# Patient Record
Sex: Female | Born: 1970 | Race: Black or African American | Hispanic: No | Marital: Married | Smoking: Never smoker
Health system: Southern US, Community
[De-identification: ages and names within clinical notes are randomized; demographics above are authoritative.]

## PROBLEM LIST (undated history)

## (undated) DIAGNOSIS — I10 Essential (primary) hypertension: Secondary | ICD-10-CM

## (undated) HISTORY — PX: GALLBLADDER SURGERY: SHX652

---

## 1997-08-27 ENCOUNTER — Inpatient Hospital Stay (HOSPITAL_COMMUNITY): Admission: AD | Admit: 1997-08-27 | Discharge: 1997-08-27 | Payer: Self-pay | Admitting: Obstetrics and Gynecology

## 1997-08-27 ENCOUNTER — Inpatient Hospital Stay (HOSPITAL_COMMUNITY): Admission: AD | Admit: 1997-08-27 | Discharge: 1997-08-30 | Payer: Self-pay | Admitting: Obstetrics & Gynecology

## 1997-08-31 ENCOUNTER — Encounter: Admission: RE | Admit: 1997-08-31 | Discharge: 1997-11-29 | Payer: Self-pay | Admitting: Obstetrics & Gynecology

## 1997-11-30 ENCOUNTER — Other Ambulatory Visit: Admission: RE | Admit: 1997-11-30 | Discharge: 1997-11-30 | Payer: Self-pay | Admitting: Obstetrics & Gynecology

## 1998-11-25 ENCOUNTER — Other Ambulatory Visit: Admission: RE | Admit: 1998-11-25 | Discharge: 1998-11-25 | Payer: Self-pay | Admitting: Obstetrics and Gynecology

## 1999-06-20 ENCOUNTER — Inpatient Hospital Stay (HOSPITAL_COMMUNITY): Admission: AD | Admit: 1999-06-20 | Discharge: 1999-06-22 | Payer: Self-pay | Admitting: Obstetrics and Gynecology

## 1999-12-19 ENCOUNTER — Other Ambulatory Visit: Admission: RE | Admit: 1999-12-19 | Discharge: 1999-12-19 | Payer: Self-pay | Admitting: Obstetrics and Gynecology

## 2000-07-23 ENCOUNTER — Ambulatory Visit (HOSPITAL_COMMUNITY): Admission: RE | Admit: 2000-07-23 | Discharge: 2000-07-23 | Payer: Self-pay | Admitting: Family Medicine

## 2000-07-23 ENCOUNTER — Encounter: Payer: Self-pay | Admitting: Family Medicine

## 2000-08-01 ENCOUNTER — Encounter (INDEPENDENT_AMBULATORY_CARE_PROVIDER_SITE_OTHER): Payer: Self-pay | Admitting: Specialist

## 2000-08-01 ENCOUNTER — Observation Stay (HOSPITAL_COMMUNITY): Admission: RE | Admit: 2000-08-01 | Discharge: 2000-08-02 | Payer: Self-pay | Admitting: Surgery

## 2000-12-25 ENCOUNTER — Other Ambulatory Visit: Admission: RE | Admit: 2000-12-25 | Discharge: 2000-12-25 | Payer: Self-pay | Admitting: Obstetrics and Gynecology

## 2001-08-22 ENCOUNTER — Ambulatory Visit (HOSPITAL_COMMUNITY): Admission: RE | Admit: 2001-08-22 | Discharge: 2001-08-22 | Payer: Self-pay | Admitting: Family Medicine

## 2001-08-22 ENCOUNTER — Encounter: Payer: Self-pay | Admitting: Family Medicine

## 2002-03-14 ENCOUNTER — Encounter: Admission: RE | Admit: 2002-03-14 | Discharge: 2002-03-14 | Payer: Self-pay | Admitting: Obstetrics and Gynecology

## 2002-03-14 ENCOUNTER — Encounter: Payer: Self-pay | Admitting: Obstetrics and Gynecology

## 2004-06-28 ENCOUNTER — Other Ambulatory Visit: Admission: RE | Admit: 2004-06-28 | Discharge: 2004-06-28 | Payer: Self-pay | Admitting: Obstetrics and Gynecology

## 2004-07-01 ENCOUNTER — Encounter: Admission: RE | Admit: 2004-07-01 | Discharge: 2004-07-01 | Payer: Self-pay | Admitting: Obstetrics and Gynecology

## 2005-10-23 ENCOUNTER — Other Ambulatory Visit: Admission: RE | Admit: 2005-10-23 | Discharge: 2005-10-23 | Payer: Self-pay | Admitting: Obstetrics and Gynecology

## 2007-02-20 ENCOUNTER — Encounter: Admission: RE | Admit: 2007-02-20 | Discharge: 2007-02-20 | Payer: Self-pay | Admitting: Obstetrics and Gynecology

## 2008-02-21 ENCOUNTER — Encounter: Admission: RE | Admit: 2008-02-21 | Discharge: 2008-02-21 | Payer: Self-pay | Admitting: Obstetrics and Gynecology

## 2010-06-14 ENCOUNTER — Other Ambulatory Visit: Payer: Self-pay | Admitting: Family Medicine

## 2010-06-14 DIAGNOSIS — Z1231 Encounter for screening mammogram for malignant neoplasm of breast: Secondary | ICD-10-CM

## 2010-06-17 ENCOUNTER — Ambulatory Visit
Admission: RE | Admit: 2010-06-17 | Discharge: 2010-06-17 | Disposition: A | Discharge status: Home / Self Care | Payer: BC Managed Care – PPO | Source: Ambulatory Visit | Attending: Family Medicine | Admitting: Family Medicine

## 2010-06-17 DIAGNOSIS — Z1231 Encounter for screening mammogram for malignant neoplasm of breast: Secondary | ICD-10-CM

## 2010-07-18 ENCOUNTER — Other Ambulatory Visit: Payer: Self-pay | Admitting: Family Medicine

## 2010-07-18 ENCOUNTER — Other Ambulatory Visit (HOSPITAL_COMMUNITY)
Admission: RE | Admit: 2010-07-18 | Discharge: 2010-07-18 | Disposition: A | Discharge status: Home / Self Care | Payer: BC Managed Care – PPO | Source: Ambulatory Visit | Attending: Family Medicine | Admitting: Family Medicine

## 2010-07-18 DIAGNOSIS — Z0000 Encounter for general adult medical examination without abnormal findings: Secondary | ICD-10-CM | POA: Insufficient documentation

## 2010-09-23 NOTE — Op Note (Signed)
[facility]  Patient:    Annette Thomas, Annette Thomas                      MRN: [id number] Proc. Date: 08/01/00 Adm. Date:  [id number] Attending:  Oletha Cruel CC:         Judithann Sauger, M.D.  Emeline General Dema Severin, M.D.   Operative Report  CCS#:  [id number]  PREOPERATIVE DIAGNOSIS:  Chronic calculous cholecystitis.  POSTOPERATIVE DIAGNOSIS:  Chronic calculous cholecystitis.  OPERATION PERFORMED:  Laparoscopic cholecystectomy.  SURGEON:  Dr. Margot Chimes.  ASSISTANT:  Dr. March Rummage.  ANESTHESIA:  General endotracheal.  CLINICAL HISTORY:  The patient is a 40 year old woman with a recent development of biliary type symptoms. Multiple small stones were seen on ultrasound.  DESCRIPTION OF PROCEDURE:  The patient was brought to the operating room and after satisfactory general endotracheal anesthesia had been obtained, the abdomen was prepped and draped. A 0.25% Marcaine was used for each incision. I made an umbilical incision, picked up the fascia with a Kocher, opened it and entered the peritoneal cavity under direct vision. A pursestring was placed. The Hasson was introduced and the abdomen insufflated to 15. The camera was placed and there appeared to be no gross abnormalities and no evidence of any bowel injuries with the entry.  The patient was placed in reverse Trendelenburg and tilted to the left. A 10 mm trocar was placed in the epigastrium and two 5s in the right upper abdomen, all under direct vision. The gallbladder was grasped and retracted over the liver. The peritoneum over the cystic duct was opened, there was a lot of fibrosis here and we could see that the common duct was tenting up, so I opened the peritoneum on both sides of the gallbladder to make a window back in the triangle of Calot. The cystic artery was dissected out and was coming right up on top of the cystic duct and up onto the gallbladder and I dissected it up onto the gallbladder to  make sure that anatomy was clear and got a good window around the cystic duct so that I could clearly see its junction with the gallbladder. It appeared that the cystic duct was relatively short. Once the anatomy was clear, I clipped the cystic artery and divided it. This gave a little bit more mobility and the cystic duct was then clipped with two clips on the stay side and divided. A small posterior branch of the cystic artery was then dissected out, clipped and divided.  The gallbladder was removed from below to above with coagulation current and the cautery. Just prior to disconnecting, we made sure everything was dry and then disconnected the gallbladder and brought it out the umbilical port.  The abdomen was reinsufflated and final check made for hemostasis and again everything appeared dry. The lateral ports were removed under direct vision. The umbilical port was removed and the pursestring tied down with the camera in the epigastric port to check that site. The abdomen was deflated through the epigastric port. The skin was closed with 4-0 monocryl subcuticular plus Steri-Strips. The patient tolerated the procedure well. There were no operative complications and all counts were correct. DD:  08/01/00 TD:  08/01/00 Job: [id number]

## 2011-07-21 ENCOUNTER — Other Ambulatory Visit: Payer: Self-pay | Admitting: Obstetrics and Gynecology

## 2011-07-21 DIAGNOSIS — Z1231 Encounter for screening mammogram for malignant neoplasm of breast: Secondary | ICD-10-CM

## 2011-08-01 ENCOUNTER — Ambulatory Visit (INDEPENDENT_AMBULATORY_CARE_PROVIDER_SITE_OTHER): Payer: 59 | Admitting: Obstetrics and Gynecology

## 2011-08-01 ENCOUNTER — Ambulatory Visit
Admission: RE | Admit: 2011-08-01 | Discharge: 2011-08-01 | Disposition: A | Discharge status: Home / Self Care | Payer: 59 | Source: Ambulatory Visit | Attending: Obstetrics and Gynecology | Admitting: Obstetrics and Gynecology

## 2011-08-01 DIAGNOSIS — Z01419 Encounter for gynecological examination (general) (routine) without abnormal findings: Secondary | ICD-10-CM

## 2011-08-01 DIAGNOSIS — Z1231 Encounter for screening mammogram for malignant neoplasm of breast: Secondary | ICD-10-CM

## 2011-08-04 ENCOUNTER — Ambulatory Visit: Payer: 59 | Admitting: Emergency Medicine

## 2011-08-04 DIAGNOSIS — F411 Generalized anxiety disorder: Secondary | ICD-10-CM

## 2011-08-04 DIAGNOSIS — R079 Chest pain, unspecified: Secondary | ICD-10-CM

## 2011-08-04 DIAGNOSIS — R51 Headache: Secondary | ICD-10-CM

## 2011-08-04 DIAGNOSIS — M79603 Pain in arm, unspecified: Secondary | ICD-10-CM

## 2011-08-04 DIAGNOSIS — F419 Anxiety disorder, unspecified: Secondary | ICD-10-CM

## 2011-08-04 DIAGNOSIS — M79609 Pain in unspecified limb: Secondary | ICD-10-CM

## 2011-08-04 MED ORDER — HYDROCHLOROTHIAZIDE 12.5 MG PO CAPS: 12.5000 mg | ORAL_CAPSULE | Freq: Every day | ORAL | Status: DC

## 2011-08-04 MED ORDER — ALPRAZOLAM 0.25 MG PO TABS: ORAL_TABLET | ORAL | Status: DC

## 2011-08-04 NOTE — Progress Notes (Signed)
  Subjective:    Patient ID: Annette Thomas, female    DOB: 03/30/1971, 41 y.o.   MRN: [id number]  HPI patient enters with onset last night of symptoms. She was with her 2 kids and walked into her house and then had a severe panic type episodes. She felt like she was out of control. She is having crying associated with this. She has been overwhelmed recently with work and home her husband is working  out of town and so she has been taking care of her 2 kids which arre 65 and 14 by herself, As well is working full-time. She overall is in good health. She keep her regular appointments with her GYN doctor.    Review of Systems she's had a frontal type headache she's had a vague chest discomfort but overall has been feeling well.     Objective:   Physical Exam  Constitutional: She is oriented to person, place, and time. She appears well-developed.  HENT:  Head: Normocephalic.  Eyes: Pupils are equal, round, and reactive to light.  Neck: No JVD present. No tracheal deviation present. No thyromegaly present.  Cardiovascular: Normal rate and regular rhythm.   Pulmonary/Chest: No respiratory distress. She has no wheezes. She has no rales. She exhibits no tenderness.  Musculoskeletal: Normal range of motion.  Lymphadenopathy:    She has no cervical adenopathy.  Neurological: She is alert and oriented to person, place, and time. She has normal reflexes. She displays normal reflexes. No cranial nerve deficit. She exhibits normal muscle tone. Coordination normal.  Skin: Skin is warm and dry.    EKG shows a sinus arrhythmia with no acute changes      Assessment & Plan:   Patient mowed the yard yesterday and her arms started hurting after she mowed the yard. She then had an episode of severe anxiety when she came inside the house last night. She is under an incredible amount of stress related to working 2 young children and her husband being out of town.

## 2011-08-04 NOTE — Patient Instructions (Signed)
Please contact the EAP. Please followup regarding her symptoms with ear PCP and decide on further treatment

## 2011-08-28 ENCOUNTER — Other Ambulatory Visit: Payer: Self-pay

## 2011-08-28 DIAGNOSIS — Z975 Presence of (intrauterine) contraceptive device: Secondary | ICD-10-CM

## 2011-09-05 ENCOUNTER — Encounter: Payer: Self-pay | Admitting: Obstetrics and Gynecology

## 2011-09-05 ENCOUNTER — Ambulatory Visit (INDEPENDENT_AMBULATORY_CARE_PROVIDER_SITE_OTHER): Payer: 59 | Admitting: Obstetrics and Gynecology

## 2011-09-05 ENCOUNTER — Encounter: Payer: 59 | Admitting: Obstetrics and Gynecology

## 2011-09-05 ENCOUNTER — Ambulatory Visit (INDEPENDENT_AMBULATORY_CARE_PROVIDER_SITE_OTHER): Payer: 59

## 2011-09-05 ENCOUNTER — Other Ambulatory Visit: Payer: Self-pay | Admitting: Obstetrics and Gynecology

## 2011-09-05 VITALS — BP 120/72 | HR 78 | Ht 67.5 in | Wt 161.0 lb

## 2011-09-05 DIAGNOSIS — N943 Premenstrual tension syndrome: Secondary | ICD-10-CM

## 2011-09-05 DIAGNOSIS — D259 Leiomyoma of uterus, unspecified: Secondary | ICD-10-CM

## 2011-09-05 DIAGNOSIS — Z309 Encounter for contraceptive management, unspecified: Secondary | ICD-10-CM

## 2011-09-05 DIAGNOSIS — Z975 Presence of (intrauterine) contraceptive device: Secondary | ICD-10-CM

## 2011-09-05 DIAGNOSIS — IMO0001 Reserved for inherently not codable concepts without codable children: Secondary | ICD-10-CM

## 2011-09-05 DIAGNOSIS — D219 Benign neoplasm of connective and other soft tissue, unspecified: Secondary | ICD-10-CM | POA: Insufficient documentation

## 2011-09-05 DIAGNOSIS — Z30433 Encounter for removal and reinsertion of intrauterine contraceptive device: Secondary | ICD-10-CM

## 2011-09-05 NOTE — Progress Notes (Addendum)
IUD INSERTION NOTE  Annette Thomas is a 41 y.o. female G2P2002 who presents for IUD insertion after removal of her current device (Paragard)  Consent signed after risks and benefits were reviewed including but not limited to bleeding, infection, expulsion and risk of uterine perforation that may require an additional procedure for removal.  LMP: Patient's last menstrual period was 08/13/2011. UPT: negative GC / Chlamydia: N/A  Paragard LOT NUMBER: [id number]   Prepping with Betadine/Hibiclens  Removed Paragard without difficulty with ring forceps; additional prepping with Betadine then Tenaculum placed on anterior lip of cervix after Hurricane gel was applied Uterus sounded at  8 cm Insertion of Paragard IUD per protocol without any complications  Ultrasound F/U(prior to IUD removal & reinsertion) wnl except a 2.9 x 2.5 x 2.6 cm fibroid with properly placed IUD by 3D rendering.  Assessment:  IUD Insertion                        PMS                        Ultrasound F/U for missing IUD string                        Small Fibroid  Plan:  Reviewed hormonal & supplemental management  options for PMS, patient to try FemPremenstrual herbal compound.  Reviewed fibroids along with medical and surgical management, included observation  1. Patient instructed to call with oral temperature of 100.4 degrees Fahrenheit or more, excessive bleeding or pain that is not relieved with OTC analgesia taken as directed  2. Patient instructed on how  to check IUD strings and encouraged to do so after each menstrual cycle  3. Advised not to place anything in vagina or have sexual intercourse for 7 days  4. Follow-up: 4  weeks   Bernetha Anschutz  PA-C 09/05/2011 2:50 PM

## 2011-09-05 NOTE — Patient Instructions (Signed)
Follow up in 4 weeks  Call [facility] @ [phone] if:  You have a temperature greater than or equal to 100.4 degrees Farenheit orally You have pain that is not made better by the pain medication given and taken as directed You have excessive bleeding   Do not place anything in your vagina for the next 7 days

## 2011-09-11 ENCOUNTER — Telehealth: Payer: Self-pay | Admitting: Obstetrics and Gynecology

## 2011-09-11 NOTE — Telephone Encounter (Signed)
Triage received

## 2011-09-12 NOTE — Telephone Encounter (Signed)
TC TO PT ON ALL LISTED NUMBERS REGARDING MSG, LM ON VMS TO CALL BACK

## 2011-09-12 NOTE — Telephone Encounter (Signed)
PT RTND CALL, LM ON VMS TO CALL BACK

## 2011-09-13 NOTE — Telephone Encounter (Signed)
PT CALLED STATES HAD A PARAGUARD INSERTED LAST Tuesday, HAD HER LAST ONE REMOVED AND THEN REINSERTED NEW ONE.  PT STATES HAD LAST ONE IN FOR 11 YEARS, HAD SOME MODERATE BLDG AND CRAMPING, IS NOT CHANGING A SOAKED PAD Q HR AND BLDG HAS ACTUALLY GOTTEN BETTER, WANTS TO KNOW IF NORMAL.  PT ADVISED IS NORMAL TO HAVE IRREGULAR BLDG FOR UP TO 3 MONTHS ESP SINCE SHE WENT AN EXTRA YEAR WITHOUT HAVING THE IUD REINSERTED, PT ADVISED TO CALL IF BLDG OR PAIN INCREASES OR ANY OTHER CONCERNS, VOICES UNDERSTANDING.

## 2011-10-05 ENCOUNTER — Encounter: Payer: 59 | Admitting: Obstetrics and Gynecology

## 2012-02-24 ENCOUNTER — Other Ambulatory Visit: Payer: Self-pay | Admitting: *Deleted

## 2012-02-24 ENCOUNTER — Other Ambulatory Visit: Payer: Self-pay | Admitting: Emergency Medicine

## 2012-06-18 ENCOUNTER — Other Ambulatory Visit: Payer: Self-pay | Admitting: Family Medicine

## 2012-06-18 ENCOUNTER — Ambulatory Visit
Admission: RE | Admit: 2012-06-18 | Discharge: 2012-06-18 | Disposition: A | Discharge status: Home / Self Care | Payer: BC Managed Care – PPO | Source: Ambulatory Visit | Attending: Family Medicine | Admitting: Family Medicine

## 2012-06-18 DIAGNOSIS — M25571 Pain in right ankle and joints of right foot: Secondary | ICD-10-CM

## 2012-06-18 DIAGNOSIS — M79671 Pain in right foot: Secondary | ICD-10-CM

## 2012-09-13 ENCOUNTER — Other Ambulatory Visit: Payer: Self-pay

## 2012-09-13 DIAGNOSIS — Z1231 Encounter for screening mammogram for malignant neoplasm of breast: Secondary | ICD-10-CM

## 2013-02-13 ENCOUNTER — Ambulatory Visit
Admission: RE | Admit: 2013-02-13 | Discharge: 2013-02-13 | Disposition: A | Discharge status: Home / Self Care | Payer: 59 | Source: Ambulatory Visit

## 2013-02-13 DIAGNOSIS — Z1231 Encounter for screening mammogram for malignant neoplasm of breast: Secondary | ICD-10-CM

## 2014-03-09 ENCOUNTER — Encounter: Payer: Self-pay | Admitting: Obstetrics and Gynecology

## 2014-04-12 ENCOUNTER — Other Ambulatory Visit: Payer: Self-pay | Admitting: Internal Medicine

## 2014-12-03 ENCOUNTER — Other Ambulatory Visit (HOSPITAL_COMMUNITY)
Admission: RE | Admit: 2014-12-03 | Discharge: 2014-12-03 | Disposition: A | Discharge status: Home / Self Care | Payer: BLUE CROSS/BLUE SHIELD | Source: Ambulatory Visit | Attending: Family Medicine | Admitting: Family Medicine

## 2014-12-03 ENCOUNTER — Other Ambulatory Visit: Payer: Self-pay | Admitting: Family Medicine

## 2014-12-03 DIAGNOSIS — Z01419 Encounter for gynecological examination (general) (routine) without abnormal findings: Secondary | ICD-10-CM | POA: Insufficient documentation

## 2014-12-07 LAB — CYTOLOGY - PAP

## 2015-02-25 ENCOUNTER — Other Ambulatory Visit: Payer: Self-pay | Admitting: Rheumatology

## 2015-02-25 ENCOUNTER — Other Ambulatory Visit: Payer: Self-pay | Admitting: Family Medicine

## 2015-02-25 ENCOUNTER — Ambulatory Visit
Admission: RE | Admit: 2015-02-25 | Discharge: 2015-02-25 | Disposition: A | Discharge status: Home / Self Care | Payer: 59 | Source: Ambulatory Visit | Attending: Rheumatology | Admitting: Rheumatology

## 2015-02-25 ENCOUNTER — Ambulatory Visit
Admission: RE | Admit: 2015-02-25 | Discharge: 2015-02-25 | Disposition: A | Discharge status: Home / Self Care | Payer: 59 | Source: Ambulatory Visit | Attending: Family Medicine | Admitting: Family Medicine

## 2015-02-25 DIAGNOSIS — J069 Acute upper respiratory infection, unspecified: Secondary | ICD-10-CM

## 2015-02-25 DIAGNOSIS — IMO0002 Reserved for concepts with insufficient information to code with codable children: Secondary | ICD-10-CM

## 2015-02-25 DIAGNOSIS — R229 Localized swelling, mass and lump, unspecified: Principal | ICD-10-CM

## 2015-03-09 ENCOUNTER — Other Ambulatory Visit: Payer: Self-pay

## 2015-03-09 ENCOUNTER — Ambulatory Visit
Admission: RE | Admit: 2015-03-09 | Discharge: 2015-03-09 | Disposition: A | Discharge status: Home / Self Care | Payer: 59 | Source: Ambulatory Visit

## 2015-03-09 DIAGNOSIS — Z1231 Encounter for screening mammogram for malignant neoplasm of breast: Secondary | ICD-10-CM

## 2015-10-04 ENCOUNTER — Encounter (HOSPITAL_COMMUNITY): Payer: Self-pay | Admitting: Emergency Medicine

## 2015-10-04 ENCOUNTER — Emergency Department (HOSPITAL_COMMUNITY)
Admission: EM | Admit: 2015-10-04 | Discharge: 2015-10-04 | Disposition: A | Discharge status: Home / Self Care | Payer: 59 | Attending: Emergency Medicine | Admitting: Emergency Medicine

## 2015-10-04 DIAGNOSIS — Z79899 Other long term (current) drug therapy: Secondary | ICD-10-CM | POA: Diagnosis not present

## 2015-10-04 DIAGNOSIS — J029 Acute pharyngitis, unspecified: Secondary | ICD-10-CM | POA: Diagnosis present

## 2015-10-04 LAB — COMPREHENSIVE METABOLIC PANEL
ALT: 21 U/L (ref 14–54)
AST: 15 U/L (ref 15–41)
Albumin: 3.7 g/dL (ref 3.5–5.0)
Alkaline Phosphatase: 70 U/L (ref 38–126)
Anion gap: 8 (ref 5–15)
BUN: 8 mg/dL (ref 6–20)
CO2: 27 mmol/L (ref 22–32)
Calcium: 8.9 mg/dL (ref 8.9–10.3)
Chloride: 105 mmol/L (ref 101–111)
Creatinine, Ser: 0.89 mg/dL (ref 0.44–1.00)
GFR calc Af Amer: >60 mL/min (ref >60)
GFR calc non Af Amer: >60 mL/min (ref >60)
Glucose, Bld: 96 mg/dL (ref 65–99)
Potassium: 3.5 mmol/L (ref 3.5–5.1)
Sodium: 140 mmol/L (ref 135–145)
Total Bilirubin: 0.5 mg/dL (ref 0.3–1.2)
Total Protein: 7.4 g/dL (ref 6.5–8.1)

## 2015-10-04 LAB — CBC
HCT: 35.3 % — ABNORMAL LOW (ref 36.0–46.0)
Hemoglobin: 11.6 g/dL — ABNORMAL LOW (ref 12.0–15.0)
MCH: 26.1 pg (ref 26.0–34.0)
MCHC: 32.9 g/dL (ref 30.0–36.0)
MCV: 79.5 fL (ref 78.0–100.0)
Platelets: 333 10*3/uL (ref 150–400)
RBC: 4.44 MIL/uL (ref 3.87–5.11)
RDW: 14.3 % (ref 11.5–15.5)
WBC: 8.5 10*3/uL (ref 4.0–10.5)

## 2015-10-04 LAB — URINALYSIS, ROUTINE W REFLEX MICROSCOPIC
Glucose, UA: NEGATIVE mg/dL
KETONES UR: 40 mg/dL — AB
LEUKOCYTES UA: NEGATIVE
NITRITE: NEGATIVE
PH: 6 (ref 5.0–8.0)
PROTEIN: 30 mg/dL — AB
Specific Gravity, Urine: 1.025 (ref 1.005–1.030)

## 2015-10-04 LAB — LIPASE, BLOOD: Lipase: 19 U/L (ref 11–51)

## 2015-10-04 LAB — I-STAT BETA HCG BLOOD, ED (MC, WL, AP ONLY)

## 2015-10-04 LAB — URINE MICROSCOPIC-ADD ON: Squamous Epithelial / LPF: NONE SEEN

## 2015-10-04 LAB — RAPID STREP SCREEN (MED CTR MEBANE ONLY): STREPTOCOCCUS, GROUP A SCREEN (DIRECT): NEGATIVE

## 2015-10-04 MED ORDER — LIDOCAINE VISCOUS 2 % MT SOLN
15.0000 mL | Freq: Once | OROMUCOSAL | Status: AC
  Administered 2015-10-04: 15 mL via OROMUCOSAL
  Filled 2015-10-04: qty 15

## 2015-10-04 MED ORDER — DEXAMETHASONE 4 MG PO TABS
10.0000 mg | ORAL_TABLET | Freq: Once | ORAL | Status: AC
  Administered 2015-10-04: 10 mg via ORAL
  Filled 2015-10-04: qty 2

## 2015-10-04 MED ORDER — MAGIC MOUTHWASH: ORAL | Status: DC

## 2015-10-04 NOTE — ED Notes (Signed)
Pt called to get dosing on magic mouthwash prescribed to her.  Tyrone Nine EDP is already gone for the day, Ralene Bathe EDP gave VO to take magic mouthwash 63m Q6 hours.  Advised pt not to drive or work after taking med.  Pt verbalizes understanding.

## 2015-10-04 NOTE — ED Notes (Signed)
MD at bedside. 

## 2015-10-04 NOTE — ED Notes (Signed)
Pt c/o sore throat, sore neck and muscle pain, diarrhea, headache, onset Wednesday, went to clinic Saturday and given Augmenten for Strep throat although no test was done, flexeril. Pt states temperature was 101.5 F onset Saturday night, new symptoms oltalgia and dental pain with sores in mouth.

## 2015-10-04 NOTE — Discharge Instructions (Signed)
Take 4 over the counter ibuprofen tablets 3 times a day or 2 over-the-counter naproxen tablets twice a day for pain. Tylenol 1-2 tabs po q4h prn   Pharyngitis Pharyngitis is redness, pain, and swelling (inflammation) of your pharynx.  CAUSES  Pharyngitis is usually caused by infection. Most of the time, these infections are from viruses (viral) and are part of a cold. However, sometimes pharyngitis is caused by bacteria (bacterial). Pharyngitis can also be caused by allergies. Viral pharyngitis may be spread from person to person by coughing, sneezing, and personal items or utensils (cups, forks, spoons, toothbrushes). Bacterial pharyngitis may be spread from person to person by more intimate contact, such as kissing.  SIGNS AND SYMPTOMS  Symptoms of pharyngitis include:   Sore throat.   Tiredness (fatigue).   Low-grade fever.   Headache.  Joint pain and muscle aches.  Skin rashes.  Swollen lymph nodes.  Plaque-like film on throat or tonsils (often seen with bacterial pharyngitis). DIAGNOSIS  Your health care provider will ask you questions about your illness and your symptoms. Your medical history, along with a physical exam, is often all that is needed to diagnose pharyngitis. Sometimes, a rapid strep test is done. Other lab tests may also be done, depending on the suspected cause.  TREATMENT  Viral pharyngitis will usually get better in 3-4 days without the use of medicine. Bacterial pharyngitis is treated with medicines that kill germs (antibiotics).  HOME CARE INSTRUCTIONS   Drink enough water and fluids to keep your urine clear or pale yellow.   Only take over-the-counter or prescription medicines as directed by your health care provider:   If you are prescribed antibiotics, make sure you finish them even if you start to feel better.   Do not take aspirin.   Get lots of rest.   Gargle with 8 oz of salt water ( tsp of salt per 1 qt of water) as often as every  1-2 hours to soothe your throat.   Throat lozenges (if you are not at risk for choking) or sprays may be used to soothe your throat. SEEK MEDICAL CARE IF:   You have large, tender lumps in your neck.  You have a rash.  You cough up green, yellow-brown, or bloody spit. SEEK IMMEDIATE MEDICAL CARE IF:   Your neck becomes stiff.  You drool or are unable to swallow liquids.  You vomit or are unable to keep medicines or liquids down.  You have severe pain that does not go away with the use of recommended medicines.  You have trouble breathing (not caused by a stuffy nose). MAKE SURE YOU:   Understand these instructions.  Will watch your condition.  Will get help right away if you are not doing well or get worse.   This information is not intended to replace advice given to you by your health care provider. Make sure you discuss any questions you have with your health care provider.   Document Released: 04/24/2005 Document Revised: 02/12/2013 Document Reviewed: 12/30/2012 Elsevier Interactive Patient Education [employer].

## 2015-10-04 NOTE — ED Provider Notes (Signed)
CSN: [id number]     Arrival date & time 10/04/15  1027 History   First MD Initiated Contact with Patient 10/04/15 1209     Chief Complaint  Patient presents with  . Sore Throat  . Diarrhea     (Consider location/radiation/quality/duration/timing/severity/associated sxs/prior Treatment) Patient is a 45 y.o. female presenting with pharyngitis. The history is provided by the patient.  Sore Throat This is a new problem. The current episode started more than 1 week ago. The problem occurs constantly. The problem has not changed since onset.Pertinent negatives include no chest pain, no headaches and no shortness of breath. Nothing aggravates the symptoms. Nothing relieves the symptoms. She has tried nothing for the symptoms. The treatment provided no relief.   45 yo F With a chief complaint of a sore throat. This been going on for about 6 days. Was seen by her family physician who is concerned that she had strep throat and started on Augmentin. Patient has continued to have worsening symptoms. Was febrile yesterday to 101. Also having some diarrhea post antibiotic use. Denies vaginal or hand or foot involvement. History reviewed. No pertinent past medical history. Past Surgical History  Procedure Laterality Date  . Gallbladder surgery     Family History  Problem Relation Age of Onset  . Heart attack Paternal Grandfather   . Brain cancer Paternal Grandmother     tumor  . Diabetes Maternal Grandmother   . Heart attack Maternal Grandfather   . Diabetes Maternal Grandfather   . Heart attack Father   . Diabetes Mother    Social History  Substance Use Topics  . Smoking status: Never Smoker   . Smokeless tobacco: Never Used  . Alcohol Use: Yes     Comment: social   OB History    Gravida Para Term Preterm AB TAB SAB Ectopic Multiple Living   2 2 2       2      Review of Systems  Constitutional: Negative for fever and chills.  HENT: Positive for sore throat. Negative for congestion and  rhinorrhea.   Eyes: Negative for redness and visual disturbance.  Respiratory: Negative for shortness of breath and wheezing.   Cardiovascular: Negative for chest pain and palpitations.  Gastrointestinal: Negative for nausea and vomiting.  Genitourinary: Negative for dysuria and urgency.  Musculoskeletal: Negative for myalgias and arthralgias.  Skin: Negative for pallor and wound.  Neurological: Negative for dizziness and headaches.      Allergies  Review of patient's allergies indicates no known allergies.  Home Medications   Prior to Admission medications   Medication Sig Start Date End Date Taking? Authorizing Provider  amLODipine (NORVASC) 5 MG tablet Take 5 mg by mouth daily.   Yes Historical Provider, MD  cetirizine (ZYRTEC) 10 MG tablet Take 10 mg by mouth daily.   Yes Historical Provider, MD  fluticasone (FLONASE) 50 MCG/ACT nasal spray Place into both nostrils daily.   Yes Historical Provider, MD  ALPRAZolam Duanne Moron) 0.25 MG tablet TAKE ONE-HALF TO ONE TABLET BY MOUTH AS NEEDED FOR STRESS Patient not taking: Reported on 10/04/2015 02/24/12   Areta Haber Dunn, PA-C  hydrochlorothiazide (MICROZIDE) 12.5 MG capsule Take 1 capsule (12.5 mg total) by mouth daily. Patient not taking: Reported on 10/04/2015 08/04/11 08/03/12  Darlyne Russian, MD  magic mouthwash SOLN 1: 1 benadryl to maalox 10/04/15   Deno Etienne, DO   BP 132/87 mmHg  Pulse 90  Temp(Src) 99.3 F (37.4 C) (Oral)  Resp 18  SpO2 100%  Physical Exam  Constitutional: She is oriented to person, place, and time. She appears well-developed and well-nourished. No distress.  HENT:  Head: Normocephalic and atraumatic.  Pustular nodules to posterior oropharynx. Tolerating her secretions neck with full range of motion  Eyes: EOM are normal. Pupils are equal, round, and reactive to light.  Neck: Normal range of motion. Neck supple.  Cardiovascular: Normal rate and regular rhythm.  Exam reveals no gallop and no friction rub.   No  murmur heard. Pulmonary/Chest: Effort normal. She has no wheezes. She has no rales.  Abdominal: Soft. She exhibits no distension. There is no tenderness. There is no rebound and no guarding.  Musculoskeletal: She exhibits no edema or tenderness.  Neurological: She is alert and oriented to person, place, and time.  Skin: Skin is warm and dry. She is not diaphoretic.  Psychiatric: She has a normal mood and affect. Her behavior is normal.  Nursing note and vitals reviewed.   ED Course  Procedures (including critical care time) Labs Review Labs Reviewed  CBC - Abnormal; Notable for the following:    Hemoglobin 11.6 (*)    HCT 35.3 (*)    All other components within normal limits  URINALYSIS, ROUTINE W REFLEX MICROSCOPIC (NOT AT [facility]) - Abnormal; Notable for the following:    Color, Urine AMBER (*)    APPearance HAZY (*)    Hgb urine dipstick TRACE (*)    Bilirubin Urine SMALL (*)    Ketones, ur 40 (*)    Protein, ur 30 (*)    All other components within normal limits  URINE MICROSCOPIC-ADD ON - Abnormal; Notable for the following:    Bacteria, UA RARE (*)    All other components within normal limits  RAPID STREP SCREEN (NOT AT [facility])  CULTURE, GROUP A STREP ([place])  LIPASE, BLOOD  COMPREHENSIVE METABOLIC PANEL  I-STAT BETA HCG BLOOD, ED ([facility], [facility], [facility] ONLY)    Imaging Review No results found. I have personally reviewed and evaluated these images and lab results as part of my medical decision-making.   EKG Interpretation None      MDM   Final diagnoses:  Pharyngitis    45 yo F with a chief complaint of sore throat. Patient with multiple purulent appearing nodules to the back of the throat. No large enough to drain. Suspect may be bacterial in source. However continue Augmentin. Given Decadron. NSAIDs PCP follow-up.  2:33 PM:  I have discussed the diagnosis/risks/treatment options with the patient and believe the pt to be eligible for discharge home to follow-up with PCP. We  also discussed returning to the ED immediately if new or worsening sx occur. We discussed the sx which are most concerning (e.g., sudden worsening pain, fever, inability to tolerate by mouth) that necessitate immediate return. Medications administered to the patient during their visit and any new prescriptions provided to the patient are listed below.  Medications given during this visit Medications  dexamethasone (DECADRON) tablet 10 mg (10 mg Oral Given 10/04/15 1243)  lidocaine (XYLOCAINE) 2 % viscous mouth solution 15 mL (15 mLs Mouth/Throat Given 10/04/15 1243)    Discharge Medication List as of 10/04/2015 12:47 PM    START taking these medications   Details  magic mouthwash SOLN 1: 1 benadryl to maalox, Print        The patient appears reasonably screen and/or stabilized for discharge and I doubt any other medical condition or other [facility] requiring further screening, evaluation, or treatment in the ED at this  time prior to discharge.      Deno Etienne, DO 10/04/15 1433

## 2015-10-06 LAB — CULTURE, GROUP A STREP (THRC)

## 2016-03-06 ENCOUNTER — Other Ambulatory Visit: Payer: Self-pay | Admitting: Family Medicine

## 2016-03-06 DIAGNOSIS — Z1231 Encounter for screening mammogram for malignant neoplasm of breast: Secondary | ICD-10-CM

## 2016-04-03 ENCOUNTER — Ambulatory Visit: Payer: 59

## 2016-04-25 ENCOUNTER — Ambulatory Visit
Admission: RE | Admit: 2016-04-25 | Discharge: 2016-04-25 | Disposition: A | Discharge status: Home / Self Care | Payer: 59 | Source: Ambulatory Visit | Attending: Family Medicine | Admitting: Family Medicine

## 2016-04-25 DIAGNOSIS — Z1231 Encounter for screening mammogram for malignant neoplasm of breast: Secondary | ICD-10-CM

## 2016-05-31 DIAGNOSIS — E559 Vitamin D deficiency, unspecified: Secondary | ICD-10-CM | POA: Diagnosis not present

## 2016-05-31 DIAGNOSIS — I10 Essential (primary) hypertension: Secondary | ICD-10-CM | POA: Diagnosis not present

## 2016-05-31 DIAGNOSIS — Z0000 Encounter for general adult medical examination without abnormal findings: Secondary | ICD-10-CM | POA: Diagnosis not present

## 2016-06-12 DIAGNOSIS — Z124 Encounter for screening for malignant neoplasm of cervix: Secondary | ICD-10-CM | POA: Diagnosis not present

## 2016-11-16 DIAGNOSIS — R197 Diarrhea, unspecified: Secondary | ICD-10-CM | POA: Diagnosis not present

## 2016-11-17 DIAGNOSIS — R197 Diarrhea, unspecified: Secondary | ICD-10-CM | POA: Diagnosis not present

## 2016-11-20 DIAGNOSIS — J387 Other diseases of larynx: Secondary | ICD-10-CM | POA: Diagnosis not present

## 2016-11-20 DIAGNOSIS — M25562 Pain in left knee: Secondary | ICD-10-CM | POA: Diagnosis not present

## 2016-11-20 DIAGNOSIS — R197 Diarrhea, unspecified: Secondary | ICD-10-CM | POA: Diagnosis not present

## 2016-11-22 DIAGNOSIS — L52 Erythema nodosum: Secondary | ICD-10-CM | POA: Diagnosis not present

## 2016-11-22 DIAGNOSIS — R197 Diarrhea, unspecified: Secondary | ICD-10-CM | POA: Diagnosis not present

## 2016-11-22 DIAGNOSIS — J029 Acute pharyngitis, unspecified: Secondary | ICD-10-CM | POA: Diagnosis not present

## 2016-11-23 DIAGNOSIS — R197 Diarrhea, unspecified: Secondary | ICD-10-CM | POA: Diagnosis not present

## 2016-11-23 DIAGNOSIS — K625 Hemorrhage of anus and rectum: Secondary | ICD-10-CM | POA: Diagnosis not present

## 2016-11-23 DIAGNOSIS — L52 Erythema nodosum: Secondary | ICD-10-CM | POA: Diagnosis not present

## 2016-12-01 DIAGNOSIS — M2540 Effusion, unspecified joint: Secondary | ICD-10-CM | POA: Diagnosis not present

## 2016-12-05 DIAGNOSIS — K625 Hemorrhage of anus and rectum: Secondary | ICD-10-CM | POA: Diagnosis not present

## 2016-12-05 DIAGNOSIS — K5289 Other specified noninfective gastroenteritis and colitis: Secondary | ICD-10-CM | POA: Diagnosis not present

## 2016-12-05 DIAGNOSIS — K644 Residual hemorrhoidal skin tags: Secondary | ICD-10-CM | POA: Diagnosis not present

## 2016-12-13 DIAGNOSIS — H209 Unspecified iridocyclitis: Secondary | ICD-10-CM | POA: Diagnosis not present

## 2016-12-13 DIAGNOSIS — L52 Erythema nodosum: Secondary | ICD-10-CM | POA: Diagnosis not present

## 2016-12-13 DIAGNOSIS — K121 Other forms of stomatitis: Secondary | ICD-10-CM | POA: Diagnosis not present

## 2016-12-27 DIAGNOSIS — L52 Erythema nodosum: Secondary | ICD-10-CM | POA: Diagnosis not present

## 2016-12-27 DIAGNOSIS — M2550 Pain in unspecified joint: Secondary | ICD-10-CM | POA: Diagnosis not present

## 2016-12-27 DIAGNOSIS — H209 Unspecified iridocyclitis: Secondary | ICD-10-CM | POA: Diagnosis not present

## 2016-12-27 DIAGNOSIS — R350 Frequency of micturition: Secondary | ICD-10-CM | POA: Diagnosis not present

## 2017-01-10 DIAGNOSIS — K529 Noninfective gastroenteritis and colitis, unspecified: Secondary | ICD-10-CM | POA: Diagnosis not present

## 2017-01-10 DIAGNOSIS — K5900 Constipation, unspecified: Secondary | ICD-10-CM | POA: Diagnosis not present

## 2017-01-10 DIAGNOSIS — R197 Diarrhea, unspecified: Secondary | ICD-10-CM | POA: Diagnosis not present

## 2017-01-30 DIAGNOSIS — I10 Essential (primary) hypertension: Secondary | ICD-10-CM | POA: Diagnosis not present

## 2017-03-19 DIAGNOSIS — N920 Excessive and frequent menstruation with regular cycle: Secondary | ICD-10-CM | POA: Diagnosis not present

## 2017-03-19 DIAGNOSIS — R102 Pelvic and perineal pain: Secondary | ICD-10-CM | POA: Diagnosis not present

## 2017-03-26 ENCOUNTER — Other Ambulatory Visit: Payer: Self-pay | Admitting: Obstetrics and Gynecology

## 2017-03-26 DIAGNOSIS — D250 Submucous leiomyoma of uterus: Secondary | ICD-10-CM

## 2017-03-28 ENCOUNTER — Ambulatory Visit
Admission: RE | Admit: 2017-03-28 | Discharge: 2017-03-28 | Disposition: A | Discharge status: Home / Self Care | Payer: 59 | Source: Ambulatory Visit | Attending: Obstetrics and Gynecology | Admitting: Obstetrics and Gynecology

## 2017-03-28 DIAGNOSIS — N920 Excessive and frequent menstruation with regular cycle: Secondary | ICD-10-CM | POA: Diagnosis not present

## 2017-03-28 DIAGNOSIS — D250 Submucous leiomyoma of uterus: Secondary | ICD-10-CM

## 2017-03-28 HISTORY — PX: IR RADIOLOGIST EVAL & MGMT: IMG5224

## 2017-03-28 NOTE — Consult Note (Signed)
Chief Complaint: Patient was seen in consultation today for  Chief Complaint  Patient presents with  . Advice Only    Consult for [place]    at the request of [place]  Referring Physician(s): Powell,[place]  History of Present Illness: Annette Thomas is a 46 y.o. female with history of uterine fibroids and menorrhagia.  Patient's main complaint is intermittent severe pelvic pain.  Patient was recently seen by gynecology for the severe pelvic pain.  The pain did improve with NSAIDs but patient had to stop NSAIDs due to lower GI bleeding.  Patient also has had heavy menstrual bleeding for many years.  For the last few months, the bleeding has been occurring every 2 weeks.  Periods usually last about 5 days with 2-3 days of heavy bleeding.  Patient severe pelvic pain has improved in the last few weeks.  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  Patient was diagnosed with a lower GI bleed this past summer and there is concern for possible ulcerative colitis.  Patient did have some GI bleeding when she was recently put on NSAIDs and now she is on a steroid Dosepak.  Patient recently had her IUD removed.  She is married and not using any birth control at this time.  Pregnancy history is G2, P2.  Patient had a negative Pap smear on 05/09/2015.  Patient has not had an endometrial biopsy to my knowledge.  No past medical history on file.  Past Surgical History:  Procedure Laterality Date  . GALLBLADDER SURGERY      Allergies: Patient has no known allergies.  Medications: Prior to Admission medications   Medication Sig Start Date End Date Taking? Authorizing Provider  amLODipine (NORVASC) 5 MG tablet Take 5 mg by mouth daily.   Yes [provider]  cetirizine (ZYRTEC) 10 MG tablet Take 10 mg by mouth daily.   Yes [provider]  fluticasone (FLONASE) 50 MCG/ACT  nasal spray Place into both nostrils daily.   Yes [provider]  magic mouthwash SOLN 1: 1 benadryl to maalox 10/04/15  Yes Deno Etienne, DO  Multiple Vitamin (MULTIVITAMIN) tablet Take 1 tablet by mouth daily.   Yes [provider]  predniSONE (STERAPRED UNI-PAK 48 TAB) 5 MG (48) TBPK tablet Take 5 mg by mouth daily.   Yes [provider]  VITAMIN D, ERGOCALCIFEROL, PO Take 5,000 Units by mouth daily.   Yes [provider]  ALPRAZolam Duanne Moron) 0.25 MG tablet TAKE ONE-HALF TO ONE TABLET BY MOUTH AS NEEDED FOR STRESS Patient not taking: Reported on 10/04/2015 02/24/12   Rise Mu, PA-C  hydrochlorothiazide (MICROZIDE) 12.5 MG capsule Take 1 capsule (12.5 mg total) by mouth daily. Patient not taking: Reported on 10/04/2015 08/04/11 08/03/12  Darlyne Russian, MD     Family History  Problem Relation Age of Onset  . Heart attack Paternal Grandfather   . Brain cancer Paternal Grandmother        tumor  . Diabetes Maternal Grandmother   . Heart attack Maternal Grandfather   . Diabetes Maternal Grandfather   . Heart attack Father   . Diabetes Mother     Social History   Socioeconomic History  . Marital status: Married    Spouse name: Not on file  . Number of children: Not on file  . Years of education: Not on file  . Highest education level: Not on file  Social Needs  . Financial resource strain: Not on file  . Food insecurity - worry: Not on file  . Food insecurity - inability: Not on file  . Transportation needs - medical: Not on file  . Transportation needs - non-medical: Not on file  Occupational History  . Not on file  Tobacco Use  . Smoking status: Never Smoker  . Smokeless tobacco: Never Used  Substance and Sexual Activity  . Alcohol use: Yes    Comment: social  . Drug use: No  . Sexual activity: Yes    Partners: Male    Birth control/protection: IUD    Comment: paragard  Other Topics Concern  . Not on file  Social History Narrative    . Not on file     Review of Systems  Constitutional: Negative.   Gastrointestinal: Positive for blood in stool.  Genitourinary: Positive for pelvic pain.  Musculoskeletal: Positive for joint swelling and myalgias.    Vital Signs: Ht 5' 7.5" (1.715 m)   Wt 140 lb (63.5 kg)   BMI 21.60 kg/m   Physical Exam  Constitutional: She appears well-developed and well-nourished. No distress.  Cardiovascular: Normal rate, regular rhythm, normal heart sounds and intact distal pulses.  Pulmonary/Chest: Effort normal and breath sounds normal.  Abdominal: Soft. Bowel sounds are normal. She exhibits no distension. There is tenderness.  Focal tenderness in the anterior lower pelvis near the pubic symphysis.  Uterus is not definitely palpable.   Imaging: Ultrasound 06/12/2016: Uterus is anteverted and measures 12.5 cm from fundus to external os.  Fibroids are present.  Largest fibroid measures up to 6.5 cm.  IUD in place.  Limited views of right ovary appears normal.  Left ovary with simple cyst measures 2.3 cm appears normal.  No free fluid.  No adnexal masses.  Labs:  CBC: No results for input(s): WBC, HGB, HCT, PLT in the last 8760 hours.  COAGS: No results for input(s): INR, APTT in the last 8760 hours.  BMP: No results for input(s): NA, K, CL, CO2, GLUCOSE, BUN, CALCIUM, CREATININE, GFRNONAA, GFRAA in the last 8760 hours.  Invalid input(s): CMP  LIVER FUNCTION TESTS: No results for input(s): BILITOT, AST, ALT, ALKPHOS, PROT, ALBUMIN in the last 8760 hours.  TUMOR MARKERS: No results for input(s): AFPTM, CEA, CA199, CHROMGRNA in the last 8760 hours.  Assessment and Plan:  46 year old female with uterine fibroids, intermittent severe pelvic pain and menorrhagia.  The pelvic pain is the patient's main complaint.  The pelvic pain does appear to be associated with her menstrual bleeding but she does have some lingering pain all of the time.  We discussed treatment options for uterine  fibroids.  She is not interested in a hysterectomy or myomectomy.  Patient does not want to take hormonal therapy at this time.  I explained uterine artery embolization procedure in depth.  We discussed the preprocedure workup including a pelvic MRI and endometrial biopsy.  We discussed how the procedure will be performed with moderate sedation in the IR suite.  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  Due to patient's rheumatology symptoms and lower GI bleeding, she will probably not be a candidate for NSAIDs after the procedure but we could consider giving her steroid Dosepak to help with the symptoms and uterine inflammation. 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  Prior ultrasound demonstrated uterine fibroids, largest measuring up to 6.5 cm.  I think the patient would be a candidate for uterine artery embolization as long as there is no contraindications with a pelvic MRI or endometrial biopsy.  I think she would get a good outcome with regards to the menstrual bleeding.  With regards to the pelvic pain, I would need to correlate with MRI findings to see if the fibroid location is corresponding with her pain.  Patient would like to think about all this information prior to arranging for the pelvic MRI or endometrial biopsy.  She will be in contact with our office.  Thank you for this interesting consult.  I greatly enjoyed meeting Annette Thomas and look forward to participating in their care.  A copy of this report was sent to the requesting provider on this date.  Electronically Signed: Burman Riis 03/28/2017, 11:00 AM   I spent a total of  30 Minutes   in face to face in clinical consultation, greater than 50% of which was counseling/coordinating care for uterine fibroids.

## 2017-04-09 DIAGNOSIS — L52 Erythema nodosum: Secondary | ICD-10-CM | POA: Diagnosis not present

## 2017-04-09 DIAGNOSIS — K529 Noninfective gastroenteritis and colitis, unspecified: Secondary | ICD-10-CM | POA: Diagnosis not present

## 2017-04-09 DIAGNOSIS — Z8371 Family history of colonic polyps: Secondary | ICD-10-CM | POA: Diagnosis not present

## 2017-04-17 ENCOUNTER — Encounter: Payer: Self-pay | Admitting: Diagnostic Radiology

## 2017-05-14 DIAGNOSIS — L52 Erythema nodosum: Secondary | ICD-10-CM | POA: Diagnosis not present

## 2017-05-14 DIAGNOSIS — H209 Unspecified iridocyclitis: Secondary | ICD-10-CM | POA: Diagnosis not present

## 2017-05-14 DIAGNOSIS — K5190 Ulcerative colitis, unspecified, without complications: Secondary | ICD-10-CM | POA: Diagnosis not present

## 2017-07-09 DIAGNOSIS — E559 Vitamin D deficiency, unspecified: Secondary | ICD-10-CM | POA: Diagnosis not present

## 2017-07-09 DIAGNOSIS — Z0000 Encounter for general adult medical examination without abnormal findings: Secondary | ICD-10-CM | POA: Diagnosis not present

## 2017-07-09 DIAGNOSIS — I10 Essential (primary) hypertension: Secondary | ICD-10-CM | POA: Diagnosis not present

## 2017-08-13 DIAGNOSIS — R1030 Lower abdominal pain, unspecified: Secondary | ICD-10-CM | POA: Diagnosis not present

## 2017-08-13 DIAGNOSIS — Z8371 Family history of colonic polyps: Secondary | ICD-10-CM | POA: Diagnosis not present

## 2017-08-13 DIAGNOSIS — K529 Noninfective gastroenteritis and colitis, unspecified: Secondary | ICD-10-CM | POA: Diagnosis not present

## 2017-08-14 DIAGNOSIS — M25561 Pain in right knee: Secondary | ICD-10-CM | POA: Diagnosis not present

## 2017-08-14 DIAGNOSIS — J029 Acute pharyngitis, unspecified: Secondary | ICD-10-CM | POA: Diagnosis not present

## 2017-08-15 ENCOUNTER — Other Ambulatory Visit: Payer: Self-pay | Admitting: Gastroenterology

## 2017-08-15 DIAGNOSIS — K529 Noninfective gastroenteritis and colitis, unspecified: Secondary | ICD-10-CM

## 2017-08-18 ENCOUNTER — Ambulatory Visit
Admission: RE | Admit: 2017-08-18 | Discharge: 2017-08-18 | Disposition: A | Discharge status: Home / Self Care | Payer: 59 | Source: Ambulatory Visit | Attending: Gastroenterology | Admitting: Gastroenterology

## 2017-08-18 DIAGNOSIS — D259 Leiomyoma of uterus, unspecified: Secondary | ICD-10-CM | POA: Diagnosis not present

## 2017-08-18 DIAGNOSIS — K529 Noninfective gastroenteritis and colitis, unspecified: Secondary | ICD-10-CM

## 2017-08-18 MED ORDER — IOPAMIDOL (ISOVUE-300) INJECTION 61%
100.0000 mL | Freq: Once | INTRAVENOUS | Status: AC | PRN
  Administered 2017-08-18: 100 mL via INTRAVENOUS

## 2017-10-22 DIAGNOSIS — Z8371 Family history of colonic polyps: Secondary | ICD-10-CM | POA: Diagnosis not present

## 2017-10-22 DIAGNOSIS — K529 Noninfective gastroenteritis and colitis, unspecified: Secondary | ICD-10-CM | POA: Diagnosis not present

## 2018-01-14 DIAGNOSIS — I10 Essential (primary) hypertension: Secondary | ICD-10-CM | POA: Diagnosis not present

## 2018-02-07 DIAGNOSIS — K5289 Other specified noninfective gastroenteritis and colitis: Secondary | ICD-10-CM | POA: Diagnosis not present

## 2018-02-07 DIAGNOSIS — K529 Noninfective gastroenteritis and colitis, unspecified: Secondary | ICD-10-CM | POA: Diagnosis not present

## 2018-02-13 DIAGNOSIS — D259 Leiomyoma of uterus, unspecified: Secondary | ICD-10-CM | POA: Diagnosis not present

## 2018-02-13 DIAGNOSIS — Z6823 Body mass index (BMI) 23.0-23.9, adult: Secondary | ICD-10-CM | POA: Diagnosis not present

## 2018-02-13 DIAGNOSIS — Z01419 Encounter for gynecological examination (general) (routine) without abnormal findings: Secondary | ICD-10-CM | POA: Diagnosis not present

## 2018-02-13 DIAGNOSIS — Z124 Encounter for screening for malignant neoplasm of cervix: Secondary | ICD-10-CM | POA: Diagnosis not present

## 2018-02-13 DIAGNOSIS — K529 Noninfective gastroenteritis and colitis, unspecified: Secondary | ICD-10-CM | POA: Diagnosis not present

## 2018-02-13 DIAGNOSIS — R5383 Other fatigue: Secondary | ICD-10-CM | POA: Diagnosis not present

## 2018-02-20 DIAGNOSIS — M2550 Pain in unspecified joint: Secondary | ICD-10-CM | POA: Diagnosis not present

## 2018-02-20 DIAGNOSIS — L52 Erythema nodosum: Secondary | ICD-10-CM | POA: Diagnosis not present

## 2018-02-20 DIAGNOSIS — H209 Unspecified iridocyclitis: Secondary | ICD-10-CM | POA: Diagnosis not present

## 2018-04-21 DIAGNOSIS — R1032 Left lower quadrant pain: Secondary | ICD-10-CM | POA: Diagnosis not present

## 2018-04-25 DIAGNOSIS — N920 Excessive and frequent menstruation with regular cycle: Secondary | ICD-10-CM | POA: Diagnosis not present

## 2018-04-25 DIAGNOSIS — R102 Pelvic and perineal pain: Secondary | ICD-10-CM | POA: Diagnosis not present

## 2018-05-14 DIAGNOSIS — R109 Unspecified abdominal pain: Secondary | ICD-10-CM | POA: Diagnosis not present

## 2018-05-14 DIAGNOSIS — K529 Noninfective gastroenteritis and colitis, unspecified: Secondary | ICD-10-CM | POA: Diagnosis not present

## 2018-05-28 DIAGNOSIS — L52 Erythema nodosum: Secondary | ICD-10-CM | POA: Diagnosis not present

## 2018-05-28 DIAGNOSIS — K5190 Ulcerative colitis, unspecified, without complications: Secondary | ICD-10-CM | POA: Diagnosis not present

## 2018-05-28 DIAGNOSIS — H209 Unspecified iridocyclitis: Secondary | ICD-10-CM | POA: Diagnosis not present

## 2018-05-30 DIAGNOSIS — R102 Pelvic and perineal pain: Secondary | ICD-10-CM | POA: Diagnosis not present

## 2018-05-30 DIAGNOSIS — N926 Irregular menstruation, unspecified: Secondary | ICD-10-CM | POA: Diagnosis not present

## 2018-05-30 DIAGNOSIS — D259 Leiomyoma of uterus, unspecified: Secondary | ICD-10-CM | POA: Diagnosis not present

## 2018-07-12 DIAGNOSIS — Z0000 Encounter for general adult medical examination without abnormal findings: Secondary | ICD-10-CM | POA: Diagnosis not present

## 2018-07-12 DIAGNOSIS — I10 Essential (primary) hypertension: Secondary | ICD-10-CM | POA: Diagnosis not present

## 2018-07-12 DIAGNOSIS — E559 Vitamin D deficiency, unspecified: Secondary | ICD-10-CM | POA: Diagnosis not present

## 2018-09-25 DIAGNOSIS — R109 Unspecified abdominal pain: Secondary | ICD-10-CM | POA: Diagnosis not present

## 2018-09-25 DIAGNOSIS — K529 Noninfective gastroenteritis and colitis, unspecified: Secondary | ICD-10-CM | POA: Diagnosis not present

## 2019-02-21 ENCOUNTER — Other Ambulatory Visit: Payer: Self-pay | Admitting: Family Medicine

## 2019-02-21 DIAGNOSIS — Z1231 Encounter for screening mammogram for malignant neoplasm of breast: Secondary | ICD-10-CM

## 2019-02-23 ENCOUNTER — Encounter (HOSPITAL_COMMUNITY): Payer: Self-pay | Admitting: Emergency Medicine

## 2019-02-23 ENCOUNTER — Other Ambulatory Visit: Payer: Self-pay

## 2019-02-23 ENCOUNTER — Ambulatory Visit (HOSPITAL_COMMUNITY)
Admission: EM | Admit: 2019-02-23 | Discharge: 2019-02-23 | Disposition: A | Discharge status: Home / Self Care | Payer: 59 | Attending: Family Medicine | Admitting: Family Medicine

## 2019-02-23 DIAGNOSIS — U071 COVID-19: Secondary | ICD-10-CM | POA: Insufficient documentation

## 2019-02-23 DIAGNOSIS — Z20822 Contact with and (suspected) exposure to covid-19: Secondary | ICD-10-CM

## 2019-02-23 DIAGNOSIS — Z20828 Contact with and (suspected) exposure to other viral communicable diseases: Secondary | ICD-10-CM

## 2019-02-23 DIAGNOSIS — J069 Acute upper respiratory infection, unspecified: Secondary | ICD-10-CM | POA: Insufficient documentation

## 2019-02-23 MED ORDER — CETIRIZINE HCL 10 MG PO CAPS: 10.0000 mg | ORAL_CAPSULE | Freq: Every day | ORAL | 0 refills | Status: AC

## 2019-02-23 MED ORDER — FLUTICASONE PROPIONATE 50 MCG/ACT NA SUSP
1.0000 | Freq: Every day | NASAL | 0 refills | Status: AC
Start: 2019-02-23 — End: 2019-03-11

## 2019-02-23 MED ORDER — BENZONATATE 200 MG PO CAPS: 200.0000 mg | ORAL_CAPSULE | Freq: Three times a day (TID) | ORAL | 0 refills | Status: AC | PRN

## 2019-02-23 NOTE — Discharge Instructions (Signed)
Covid swab pending, please monitor my chart results Begin daily cetirizine help with congestion/drainage Daily Flonase nasal spray 1 to 2 sprays in each nostril daily Tessalon as needed for cough every 8 hours Please rest and drink plenty of fluids  Follow-up if symptoms not resolving or worsening over the next week

## 2019-02-23 NOTE — ED Provider Notes (Signed)
[facility]    CSN: [id number] Arrival date & time: 02/23/19  1716      History   Chief Complaint Chief Complaint  Patient presents with   Cough    HPI Annette Thomas is a 48 y.o. female no significant past medical history presenting today for evaluation of URI symptoms.  Patient states that on Friday she started to develop some congestion, rhinorrhea.  She had a infrequent cough at that time, but the cough has become more prominent.  In the past 24 hours she has developed chills, body aches as well as a low-grade fever up to 99.5.  She recently found out that she had a possible exposure to Covid a few days prior to symptom onset while at work.  No other known exposures.  HPI  History reviewed. No pertinent past medical history.  Patient Active Problem List   Diagnosis Date Noted   Fibroid 09/05/2011    Past Surgical History:  Procedure Laterality Date   GALLBLADDER SURGERY     IR RADIOLOGIST EVAL & MGMT  03/28/2017    OB History    Gravida  2   Para  2   Term  2   Preterm      AB      Living  2     SAB      TAB      Ectopic      Multiple      Live Births               Home Medications    Prior to Admission medications   Medication Sig Start Date End Date Taking? Authorizing Provider  mesalamine (LIALDA) 1.2 g EC tablet TAKE 2 TABLETS BY MOUTH ONCE DAILY FOR 90 DAYS 01/29/19  Yes [provider]  amLODipine (NORVASC) 5 MG tablet Take 5 mg by mouth daily.    [provider]  benzonatate (TESSALON) 200 MG capsule Take 1 capsule (200 mg total) by mouth 3 (three) times daily as needed for up to 7 days for cough. 02/23/19 03/02/19  Irelyn Perfecto C, PA-C  Cetirizine HCl 10 MG CAPS Take 1 capsule (10 mg total) by mouth daily for 10 days. 02/23/19 03/05/19  Amonie Wisser C, PA-C  fluticasone (FLONASE) 50 MCG/ACT nasal spray Place 1-2 sprays into both nostrils daily for 7 days. 02/23/19 03/02/19  Tykesha Konicki C,  PA-C  Multiple Vitamin (MULTIVITAMIN) tablet Take 1 tablet by mouth daily.    [provider]  VITAMIN D, ERGOCALCIFEROL, PO Take 5,000 Units by mouth daily.    [provider]  hydrochlorothiazide (MICROZIDE) 12.5 MG capsule Take 1 capsule (12.5 mg total) by mouth daily. Patient not taking: Reported on 10/04/2015 08/04/11 02/23/19  Darlyne Russian, MD  magic mouthwash SOLN 1: 1 benadryl to maalox 10/04/15 02/23/19  Deno Etienne, DO    Family History Family History  Problem Relation Age of Onset   Heart attack Paternal Grandfather    Brain cancer Paternal Grandmother        tumor   Diabetes Maternal Grandmother    Heart attack Maternal Grandfather    Diabetes Maternal Grandfather    Heart attack Father    Diabetes Mother     Social History Social History   Tobacco Use   Smoking status: Never Smoker   Smokeless tobacco: Never Used  Substance Use Topics   Alcohol use: Yes    Comment: social   Drug use: No     Allergies  Patient has no known allergies.   Review of Systems Review of Systems  Constitutional: Positive for chills. Negative for activity change, appetite change, fatigue and fever.  HENT: Positive for congestion, rhinorrhea and sore throat. Negative for ear pain, sinus pressure and trouble swallowing.   Eyes: Negative for discharge and redness.  Respiratory: Positive for cough. Negative for chest tightness and shortness of breath.   Cardiovascular: Negative for chest pain.  Gastrointestinal: Negative for abdominal pain, diarrhea, nausea and vomiting.  Musculoskeletal: Positive for myalgias.  Skin: Negative for rash.  Neurological: Negative for dizziness, light-headedness and headaches.     Physical Exam Triage Vital Signs ED Triage Vitals  Enc Vitals Group     BP 02/23/19 1734 (!) 154/96     Pulse Rate 02/23/19 1734 97     Resp 02/23/19 1734 18     Temp 02/23/19 1734 98.7 F (37.1 C)     Temp src --      SpO2 02/23/19 1734  100 %     Weight --      Height --      Head Circumference --      Peak Flow --      Pain Score 02/23/19 1736 4     Pain Loc --      Pain Edu? --      Excl. in [place]? --    No data found.  Updated Vital Signs BP (!) 154/96    Pulse 97    Temp 98.7 F (37.1 C)    Resp 18    SpO2 100%   Visual Acuity Right Eye Distance:   Left Eye Distance:   Bilateral Distance:    Right Eye Near:   Left Eye Near:    Bilateral Near:     Physical Exam Vitals signs and nursing note reviewed.  Constitutional:      General: She is not in acute distress.    Appearance: She is well-developed.  HENT:     Head: Normocephalic and atraumatic.     Ears:     Comments: Bilateral ears without tenderness to palpation of external auricle, tragus and mastoid, EAC's without erythema or swelling, TM's with good bony landmarks and cone of light. Non erythematous.     Nose:     Comments: Nasal mucosa very erythematous, significantly swollen turbinates bilaterally    Mouth/Throat:     Comments: Oral mucosa pink and moist, no tonsillar enlargement or exudate. Posterior pharynx patent and nonerythematous, no uvula deviation or swelling. Normal phonation.  Eyes:     Conjunctiva/sclera: Conjunctivae normal.  Neck:     Musculoskeletal: Neck supple.  Cardiovascular:     Rate and Rhythm: Normal rate and regular rhythm.     Heart sounds: No murmur.  Pulmonary:     Effort: Pulmonary effort is normal. No respiratory distress.     Breath sounds: Normal breath sounds.     Comments: Breathing comfortably at rest, CTABL, no wheezing, rales or other adventitious sounds auscultated Abdominal:     Palpations: Abdomen is soft.     Tenderness: There is no abdominal tenderness.  Skin:    General: Skin is warm and dry.  Neurological:     Mental Status: She is alert.      UC Treatments / Results  Labs (all labs ordered are listed, but only abnormal results are displayed) Labs Reviewed  NOVEL CORONAVIRUS, NAA (HOSP  ORDER, SEND-OUT TO REF LAB; TAT 18-24 HRS)    EKG   Radiology  No results found.  Procedures Procedures (including critical care time)  Medications Ordered in UC Medications - No data to display  Initial Impression / Assessment and Plan / UC Course  I have reviewed the triage vital signs and the nursing notes.  Pertinent labs & imaging results that were available during my care of the patient were reviewed by me and considered in my medical decision making (see chart for details).     Covid swab pending.  URI symptoms x3 days.  Lungs clear, vital signs stable.  Likely viral etiology.  Recommending self quarantine until Covid swab returns.  Symptomatic and supportive care, push fluids.  Discussed strict return precautions. Patient verbalized understanding and is agreeable with plan.  Final Clinical Impressions(s) / UC Diagnoses   Final diagnoses:  Viral URI with cough  Exposure to COVID-19 virus     Discharge Instructions     Covid swab pending, please monitor my chart results Begin daily cetirizine help with congestion/drainage Daily Flonase nasal spray 1 to 2 sprays in each nostril daily Tessalon as needed for cough every 8 hours Please rest and drink plenty of fluids  Follow-up if symptoms not resolving or worsening over the next week   ED Prescriptions    Medication Sig Dispense Auth. Provider   fluticasone (FLONASE) 50 MCG/ACT nasal spray Place 1-2 sprays into both nostrils daily for 7 days. 1 g Alyannah Sanks C, PA-C   Cetirizine HCl 10 MG CAPS Take 1 capsule (10 mg total) by mouth daily for 10 days. 10 capsule Clarice Bonaventure C, PA-C   benzonatate (TESSALON) 200 MG capsule Take 1 capsule (200 mg total) by mouth 3 (three) times daily as needed for up to 7 days for cough. 28 capsule Hashim Eichhorst, [place] C, PA-C     PDMP not reviewed this encounter.   Janith Lima, PA-C 02/23/19 1759

## 2019-02-23 NOTE — ED Triage Notes (Signed)
Pt was exposed to covid positive pt on Wednesday.

## 2019-02-23 NOTE — ED Triage Notes (Signed)
Pt c/o congestion, body aches, feeling bad, chills, headaches since Friday.

## 2019-02-25 ENCOUNTER — Telehealth (HOSPITAL_COMMUNITY): Payer: Self-pay | Admitting: Emergency Medicine

## 2019-02-25 LAB — NOVEL CORONAVIRUS, NAA (HOSP ORDER, SEND-OUT TO REF LAB; TAT 18-24 HRS): SARS-CoV-2, NAA: DETECTED — AB

## 2019-02-25 NOTE — Telephone Encounter (Signed)
Patient contacted and made aware of  positive  results, all questions answered Pt needed a note with her name on it that she was positive for covid. Sent note in her Mychart.

## 2019-03-08 ENCOUNTER — Emergency Department (HOSPITAL_COMMUNITY)
Admission: EM | Admit: 2019-03-08 | Discharge: 2019-03-08 | Disposition: A | Discharge status: Home / Self Care | Payer: 59 | Source: Home / Self Care | Attending: Emergency Medicine | Admitting: Emergency Medicine

## 2019-03-08 ENCOUNTER — Other Ambulatory Visit: Payer: Self-pay

## 2019-03-08 ENCOUNTER — Emergency Department (HOSPITAL_COMMUNITY): Payer: 59

## 2019-03-08 DIAGNOSIS — U071 COVID-19: Secondary | ICD-10-CM | POA: Insufficient documentation

## 2019-03-08 DIAGNOSIS — R109 Unspecified abdominal pain: Secondary | ICD-10-CM | POA: Diagnosis not present

## 2019-03-08 DIAGNOSIS — A4189 Other specified sepsis: Secondary | ICD-10-CM | POA: Diagnosis not present

## 2019-03-08 LAB — COMPREHENSIVE METABOLIC PANEL
ALT: 29 U/L (ref 0–44)
AST: 26 U/L (ref 15–41)
Albumin: 3.8 g/dL (ref 3.5–5.0)
Alkaline Phosphatase: 78 U/L (ref 38–126)
Anion gap: 14 (ref 5–15)
BUN: 8 mg/dL (ref 6–20)
CO2: 20 mmol/L — ABNORMAL LOW (ref 22–32)
Calcium: 9.5 mg/dL (ref 8.9–10.3)
Chloride: 103 mmol/L (ref 98–111)
Creatinine, Ser: 1 mg/dL (ref 0.44–1.00)
GFR calc Af Amer: >60 mL/min (ref >60)
GFR calc non Af Amer: >60 mL/min (ref >60)
Glucose, Bld: 151 mg/dL — ABNORMAL HIGH (ref 70–99)
Potassium: 3 mmol/L — ABNORMAL LOW (ref 3.5–5.1)
Sodium: 137 mmol/L (ref 135–145)
Total Bilirubin: 0.8 mg/dL (ref 0.3–1.2)
Total Protein: 7.3 g/dL (ref 6.5–8.1)

## 2019-03-08 LAB — CBC WITH DIFFERENTIAL/PLATELET
Abs Immature Granulocytes: 0.04 10*3/uL (ref 0.00–0.07)
Basophils Absolute: 0 10*3/uL (ref 0.0–0.1)
Basophils Relative: 0 %
Eosinophils Absolute: 0.3 10*3/uL (ref 0.0–0.5)
Eosinophils Relative: 3 %
HCT: 39.7 % (ref 36.0–46.0)
Hemoglobin: 13.2 g/dL (ref 12.0–15.0)
Immature Granulocytes: 0 %
Lymphocytes Relative: 10 %
Lymphs Abs: 0.9 10*3/uL (ref 0.7–4.0)
MCH: 27.8 pg (ref 26.0–34.0)
MCHC: 33.2 g/dL (ref 30.0–36.0)
MCV: 83.6 fL (ref 80.0–100.0)
Monocytes Absolute: 1 10*3/uL (ref 0.1–1.0)
Monocytes Relative: 11 %
Neutro Abs: 6.8 10*3/uL (ref 1.7–7.7)
Neutrophils Relative %: 76 %
Platelets: 314 10*3/uL (ref 150–400)
RBC: 4.75 MIL/uL (ref 3.87–5.11)
RDW: 13.8 % (ref 11.5–15.5)
WBC: 9.1 10*3/uL (ref 4.0–10.5)
nRBC: 0 % (ref 0.0–0.2)

## 2019-03-08 LAB — LACTIC ACID, PLASMA: Lactic Acid, Venous: 2.1 mmol/L (ref 0.5–1.9)

## 2019-03-08 MED ORDER — ACETAMINOPHEN 325 MG PO TABS
650.0000 mg | ORAL_TABLET | Freq: Four times a day (QID) | ORAL | Status: DC | PRN
  Administered 2019-03-08: 650 mg via ORAL
  Filled 2019-03-08: qty 2

## 2019-03-08 MED ORDER — LACTATED RINGERS IV BOLUS
1000.0000 mL | Freq: Once | INTRAVENOUS | Status: AC
  Administered 2019-03-08: 1000 mL via INTRAVENOUS

## 2019-03-08 MED ORDER — IBUPROFEN 400 MG PO TABS
600.0000 mg | ORAL_TABLET | Freq: Once | ORAL | Status: AC
  Administered 2019-03-08: 600 mg via ORAL
  Filled 2019-03-08: qty 1

## 2019-03-08 NOTE — ED Provider Notes (Signed)
[facility] Provider Note   CSN: [id number] Arrival date & time: 03/08/19  1948     History   Chief Complaint Chief Complaint  Patient presents with  . Abdominal Pain  . Nausea  . Emesis  . Diarrhea    HPI Annette Thomas is a 48 y.o. female.     HPI   Patient was diagnosed with COVID-19 after test done on 02/23/2019 from a work Covid exposure.  She went to urgent care earlier today for nausea, emesis that was nonbloody and anxiety and was given symptomatic prescriptions.  She went home, and her pharmacy was unable to fill her prescriptions yet.  She continued to feel ill and started to spike a fever despite not having a fever since her early Covid diagnosis.  Associated symptoms include midepigastric pain that is intermittent, radiates to her back.  She also has had some occasional palpitations and occasional abdominal spasms/watery diarrhea that has been her norm since being diagnosed with COVID-19.  She was concerned and came to our emergency department to be further evaluated.  Her last dose of Tylenol and/or Motrin was yesterday.  Nothing seems to make her symptoms better or worse.  Currently she does not have the midepigastric pain, nausea, emesis.  No past medical history on file.  Patient Active Problem List   Diagnosis Date Noted  . Fibroid 09/05/2011    Past Surgical History:  Procedure Laterality Date  . GALLBLADDER SURGERY    . IR RADIOLOGIST EVAL & MGMT  03/28/2017     OB History    Gravida  2   Para  2   Term  2   Preterm      AB      Living  2     SAB      TAB      Ectopic      Multiple      Live Births               Home Medications    Prior to Admission medications   Medication Sig Start Date End Date Taking? Authorizing Provider  amLODipine (NORVASC) 5 MG tablet Take 5 mg by mouth daily.    [provider]  Cetirizine HCl 10 MG CAPS Take 1 capsule (10 mg total) by mouth daily  for 10 days. 02/23/19 03/05/19  Wieters, Hallie C, PA-C  fluticasone (FLONASE) 50 MCG/ACT nasal spray Place 1-2 sprays into both nostrils daily for 7 days. 02/23/19 03/02/19  Wieters, Hallie C, PA-C  mesalamine (LIALDA) 1.2 g EC tablet TAKE 2 TABLETS BY MOUTH ONCE DAILY FOR 90 DAYS 01/29/19   [provider]  Multiple Vitamin (MULTIVITAMIN) tablet Take 1 tablet by mouth daily.    [provider]  VITAMIN D, ERGOCALCIFEROL, PO Take 5,000 Units by mouth daily.    [provider]  hydrochlorothiazide (MICROZIDE) 12.5 MG capsule Take 1 capsule (12.5 mg total) by mouth daily. Patient not taking: Reported on 10/04/2015 08/04/11 02/23/19  Darlyne Russian, MD  magic mouthwash SOLN 1: 1 benadryl to maalox 10/04/15 02/23/19  Deno Etienne, DO    Family History Family History  Problem Relation Age of Onset  . Heart attack Paternal Grandfather   . Brain cancer Paternal Grandmother        tumor  . Diabetes Maternal Grandmother   . Heart attack Maternal Grandfather   . Diabetes Maternal Grandfather   . Heart attack Father   . Diabetes Mother  Social History Social History   Tobacco Use  . Smoking status: Never Smoker  . Smokeless tobacco: Never Used  Substance Use Topics  . Alcohol use: Yes    Comment: social  . Drug use: No     Allergies   Patient has no known allergies.   Review of Systems Review of Systems  Constitutional: Positive for fever.  Eyes: Negative for visual disturbance.  Respiratory: Positive for cough. Negative for shortness of breath.   Cardiovascular: Positive for palpitations. Negative for chest pain.  Gastrointestinal: Positive for abdominal pain, diarrhea, nausea and vomiting.  Genitourinary: Negative for dysuria and vaginal discharge.  Musculoskeletal: Negative for gait problem.  Skin: Negative for rash and wound.  Neurological: Negative for syncope.  All other systems reviewed and are negative.    Physical Exam Updated Vital Signs  BP 130/82   Pulse 99   Temp 100.1 F (37.8 C) (Oral)   Resp 17   Ht 5' 7"  (1.702 m)   Wt 69.9 kg   SpO2 98%   BMI 24.12 kg/m   Physical Exam Vitals signs and nursing note reviewed.  Constitutional:      Appearance: She is well-developed. She is not ill-appearing.  HENT:     Head: Normocephalic and atraumatic.  Eyes:     Conjunctiva/sclera: Conjunctivae normal.  Neck:     Musculoskeletal: Neck supple.  Cardiovascular:     Rate and Rhythm: Regular rhythm. Tachycardia present.  Pulmonary:     Effort: Pulmonary effort is normal. No respiratory distress.     Breath sounds: Normal breath sounds.     Comments: Satting well on room air, patient does not drop her oxygen saturations while ambulating Abdominal:     General: Abdomen is flat. Bowel sounds are normal. There is no distension.     Palpations: Abdomen is soft.     Tenderness: There is no abdominal tenderness. There is no right CVA tenderness or left CVA tenderness.     Comments: No epigastric tenderness to palpation currently, no abdominal tenderness to palpation  Musculoskeletal:     Comments: No posterior calf tenderness to palpation, erythema, edema, asymmetry  Skin:    General: Skin is warm and dry.     Capillary Refill: Capillary refill takes less than 2 seconds.  Neurological:     General: No focal deficit present.     Mental Status: She is alert and oriented to person, place, and time.     Motor: No weakness.     Comments: Normal gait      ED Treatments / Results  Labs (all labs ordered are listed, but only abnormal results are displayed) Labs Reviewed  COMPREHENSIVE METABOLIC PANEL - Abnormal; Notable for the following components:      Result Value   Potassium 3.0 (*)    CO2 20 (*)    Glucose, Bld 151 (*)    All other components within normal limits  LACTIC ACID, PLASMA - Abnormal; Notable for the following components:   Lactic Acid, Venous 2.1 (*)    All other components within normal limits  CBC  WITH DIFFERENTIAL/PLATELET  LACTIC ACID, PLASMA    EKG None  Radiology Dg Chest Portable 1 View  Result Date: 03/08/2019 CLINICAL DATA:  Abdominal spasms. The patient was COVID-19 positive February 23, 2019. EXAM: PORTABLE CHEST 1 VIEW COMPARISON:  February 25, 2015 FINDINGS: The heart size and mediastinal contours are within normal limits. Both lungs are clear. The visualized skeletal structures are unremarkable. IMPRESSION: No active  disease. Electronically Signed   By: Dorise Bullion III M.D   On: 03/08/2019 21:28    Procedures Procedures (including critical care time)  Medications Ordered in ED Medications  acetaminophen (TYLENOL) tablet 650 mg (650 mg Oral Given 03/08/19 2107)  lactated ringers bolus 1,000 mL (0 mLs Intravenous Stopped 03/08/19 2319)  ibuprofen (ADVIL) tablet 600 mg (600 mg Oral Given 03/08/19 2324)     Initial Impression / Assessment and Plan / ED Course  I have reviewed the triage vital signs and the nursing notes.  Pertinent labs & imaging results that were available during my care of the patient were reviewed by me and considered in my medical decision making (see chart for details).        Annette Thomas is a 48 y.o. female with no significant past medical history presents today for epigastric pain, nausea, emesis, generalized malaise since COVID-19 diagnosis on 02/23/2027 and recent worsening over the last 2 days of her symptoms with return of a fever.  Labs and chest x-ray ordered for differential diagnosis of superimposed pneumonia, electrolyte abnormality, sepsis.  Will give fluids and Tylenol to bring down fever and tachycardia, reassess for PE.  Labs showed no significant leukocytosis, electrolyte abnormality, anemia.  Chest x-ray did not show any pneumonia.  With fluids and antipyretic, patient's heart rate decreased to 99 and her fever decreased.  Her pain is well controlled and decreased with over-the-counter Tylenol Motrin.  Plan is to discharge  with follow-up with her PCP.  Recommend that she remains quarantined despite almost 2 weeks after her initial diagnosis due to continued fever and symptoms.  Care of patient was discussed with the supervising attending.  Final Clinical Impressions(s) / ED Diagnoses   Final diagnoses:  [id number]    ED Discharge Orders    None       Julianne Rice, MD 03/08/19 4827    Varney Biles, MD 03/09/19 1950

## 2019-03-08 NOTE — ED Triage Notes (Signed)
Came in POV. C/o abdominal spasms, worsening since Thursday. C/O N/V/D as well. Reported hx of Covid + 02/23/2019; Reported hx of  ulcerative colitis as well.

## 2019-03-10 ENCOUNTER — Other Ambulatory Visit: Payer: Self-pay | Admitting: Gastroenterology

## 2019-03-10 ENCOUNTER — Other Ambulatory Visit (HOSPITAL_COMMUNITY): Payer: Self-pay | Admitting: Gastroenterology

## 2019-03-10 DIAGNOSIS — Z8719 Personal history of other diseases of the digestive system: Secondary | ICD-10-CM

## 2019-03-10 DIAGNOSIS — R197 Diarrhea, unspecified: Secondary | ICD-10-CM

## 2019-03-10 DIAGNOSIS — R109 Unspecified abdominal pain: Secondary | ICD-10-CM

## 2019-03-11 ENCOUNTER — Encounter (HOSPITAL_COMMUNITY): Payer: Self-pay | Admitting: Emergency Medicine

## 2019-03-11 ENCOUNTER — Emergency Department (HOSPITAL_COMMUNITY): Payer: 59

## 2019-03-11 ENCOUNTER — Inpatient Hospital Stay (HOSPITAL_COMMUNITY)
Admission: EM | Admit: 2019-03-11 | Discharge: 2019-03-14 | DRG: 871 | Disposition: A | Discharge status: Home / Self Care | Payer: 59 | Attending: Internal Medicine | Admitting: Internal Medicine

## 2019-03-11 ENCOUNTER — Other Ambulatory Visit: Payer: Self-pay

## 2019-03-11 DIAGNOSIS — Z79899 Other long term (current) drug therapy: Secondary | ICD-10-CM | POA: Diagnosis not present

## 2019-03-11 DIAGNOSIS — A4189 Other specified sepsis: Secondary | ICD-10-CM | POA: Diagnosis present

## 2019-03-11 DIAGNOSIS — Z8249 Family history of ischemic heart disease and other diseases of the circulatory system: Secondary | ICD-10-CM | POA: Diagnosis not present

## 2019-03-11 DIAGNOSIS — E876 Hypokalemia: Secondary | ICD-10-CM | POA: Diagnosis present

## 2019-03-11 DIAGNOSIS — K51919 Ulcerative colitis, unspecified with unspecified complications: Secondary | ICD-10-CM

## 2019-03-11 DIAGNOSIS — Z833 Family history of diabetes mellitus: Secondary | ICD-10-CM

## 2019-03-11 DIAGNOSIS — K5100 Ulcerative (chronic) pancolitis without complications: Secondary | ICD-10-CM

## 2019-03-11 DIAGNOSIS — I10 Essential (primary) hypertension: Secondary | ICD-10-CM

## 2019-03-11 DIAGNOSIS — Z7951 Long term (current) use of inhaled steroids: Secondary | ICD-10-CM | POA: Diagnosis not present

## 2019-03-11 DIAGNOSIS — Z9049 Acquired absence of other specified parts of digestive tract: Secondary | ICD-10-CM

## 2019-03-11 DIAGNOSIS — U071 COVID-19: Secondary | ICD-10-CM

## 2019-03-11 DIAGNOSIS — A419 Sepsis, unspecified organism: Secondary | ICD-10-CM

## 2019-03-11 DIAGNOSIS — K5190 Ulcerative colitis, unspecified, without complications: Secondary | ICD-10-CM | POA: Diagnosis present

## 2019-03-11 DIAGNOSIS — R109 Unspecified abdominal pain: Secondary | ICD-10-CM | POA: Diagnosis present

## 2019-03-11 LAB — C-REACTIVE PROTEIN: CRP: 27.8 mg/dL — ABNORMAL HIGH (ref <1.0)

## 2019-03-11 LAB — URINALYSIS, ROUTINE W REFLEX MICROSCOPIC
Bilirubin Urine: NEGATIVE
Glucose, UA: NEGATIVE mg/dL
Hgb urine dipstick: NEGATIVE
Ketones, ur: 20 mg/dL — AB
Leukocytes,Ua: NEGATIVE
Nitrite: NEGATIVE
Protein, ur: NEGATIVE mg/dL
Specific Gravity, Urine: 1.008 (ref 1.005–1.030)
pH: 5 (ref 5.0–8.0)

## 2019-03-11 LAB — CBC WITH DIFFERENTIAL/PLATELET
Abs Immature Granulocytes: 0 10*3/uL (ref 0.00–0.07)
Basophils Absolute: 0 10*3/uL (ref 0.0–0.1)
Basophils Relative: 0 %
Eosinophils Absolute: 0.1 10*3/uL (ref 0.0–0.5)
Eosinophils Relative: 1 %
HCT: 38.9 % (ref 36.0–46.0)
Hemoglobin: 12.9 g/dL (ref 12.0–15.0)
Lymphocytes Relative: 13 %
Lymphs Abs: 1.7 10*3/uL (ref 0.7–4.0)
MCH: 27.3 pg (ref 26.0–34.0)
MCHC: 33.2 g/dL (ref 30.0–36.0)
MCV: 82.4 fL (ref 80.0–100.0)
Monocytes Absolute: 0.7 10*3/uL (ref 0.1–1.0)
Monocytes Relative: 5 %
Neutro Abs: 10.8 10*3/uL — ABNORMAL HIGH (ref 1.7–7.7)
Neutrophils Relative %: 81 %
Platelets: 330 10*3/uL (ref 150–400)
RBC: 4.72 MIL/uL (ref 3.87–5.11)
RDW: 13.9 % (ref 11.5–15.5)
WBC: 13.3 10*3/uL — ABNORMAL HIGH (ref 4.0–10.5)
nRBC: 0 % (ref 0.0–0.2)
nRBC: 0 /100 WBC

## 2019-03-11 LAB — COMPREHENSIVE METABOLIC PANEL
ALT: 23 U/L (ref 0–44)
AST: 18 U/L (ref 15–41)
Albumin: 3.5 g/dL (ref 3.5–5.0)
Alkaline Phosphatase: 79 U/L (ref 38–126)
Anion gap: 14 (ref 5–15)
BUN: 6 mg/dL (ref 6–20)
CO2: 22 mmol/L (ref 22–32)
Calcium: 9.1 mg/dL (ref 8.9–10.3)
Chloride: 102 mmol/L (ref 98–111)
Creatinine, Ser: 1.03 mg/dL — ABNORMAL HIGH (ref 0.44–1.00)
GFR calc Af Amer: >60 mL/min (ref >60)
GFR calc non Af Amer: >60 mL/min (ref >60)
Glucose, Bld: 105 mg/dL — ABNORMAL HIGH (ref 70–99)
Potassium: 3.1 mmol/L — ABNORMAL LOW (ref 3.5–5.1)
Sodium: 138 mmol/L (ref 135–145)
Total Bilirubin: 1.1 mg/dL (ref 0.3–1.2)
Total Protein: 7.1 g/dL (ref 6.5–8.1)

## 2019-03-11 LAB — PROCALCITONIN: Procalcitonin: 0.62 ng/mL

## 2019-03-11 LAB — LACTIC ACID, PLASMA
Lactic Acid, Venous: 1.9 mmol/L (ref 0.5–1.9)
Lactic Acid, Venous: 1.1 mmol/L (ref 0.5–1.9)

## 2019-03-11 LAB — I-STAT BETA HCG BLOOD, ED (MC, WL, AP ONLY): I-stat hCG, quantitative: <5 m[IU]/mL (ref <5)

## 2019-03-11 MED ORDER — ONDANSETRON HCL 4 MG/2ML IJ SOLN
4.0000 mg | Freq: Once | INTRAMUSCULAR | Status: AC
  Administered 2019-03-11: 18:00:00 4 mg via INTRAVENOUS
  Filled 2019-03-11: qty 2

## 2019-03-11 MED ORDER — METRONIDAZOLE IN NACL 5-0.79 MG/ML-% IV SOLN
500.0000 mg | Freq: Three times a day (TID) | INTRAVENOUS | Status: DC
  Administered 2019-03-12 – 2019-03-14 (×7): 500 mg via INTRAVENOUS
  Filled 2019-03-11 (×8): qty 100

## 2019-03-11 MED ORDER — SODIUM CHLORIDE 0.9 % IV SOLN
2.0000 g | Freq: Once | INTRAVENOUS | Status: AC
  Administered 2019-03-11: 20:00:00 2 g via INTRAVENOUS
  Filled 2019-03-11: qty 20

## 2019-03-11 MED ORDER — IOHEXOL 350 MG/ML SOLN
100.0000 mL | Freq: Once | INTRAVENOUS | Status: AC | PRN
  Administered 2019-03-11: 22:00:00 100 mL via INTRAVENOUS

## 2019-03-11 MED ORDER — ACETAMINOPHEN 650 MG RE SUPP: 650.0000 mg | Freq: Four times a day (QID) | RECTAL | Status: DC | PRN

## 2019-03-11 MED ORDER — MORPHINE SULFATE (PF) 2 MG/ML IV SOLN
2.0000 mg | INTRAVENOUS | Status: DC | PRN
  Administered 2019-03-11: 2 mg via INTRAVENOUS
  Filled 2019-03-11: qty 1

## 2019-03-11 MED ORDER — SODIUM CHLORIDE 0.9 % IV SOLN
Freq: Once | INTRAVENOUS | Status: AC
  Administered 2019-03-11: via INTRAVENOUS

## 2019-03-11 MED ORDER — SODIUM CHLORIDE 0.9 % IV SOLN
2.0000 g | INTRAVENOUS | Status: DC
  Administered 2019-03-12 – 2019-03-13 (×2): 2 g via INTRAVENOUS
  Filled 2019-03-11: qty 20
  Filled 2019-03-11: qty 2
  Filled 2019-03-11: qty 20

## 2019-03-11 MED ORDER — POTASSIUM CHLORIDE CRYS ER 20 MEQ PO TBCR
40.0000 meq | EXTENDED_RELEASE_TABLET | Freq: Once | ORAL | Status: AC
  Administered 2019-03-11: 40 meq via ORAL
  Filled 2019-03-11: qty 2

## 2019-03-11 MED ORDER — SODIUM CHLORIDE 0.9 % IV BOLUS
1000.0000 mL | Freq: Once | INTRAVENOUS | Status: AC
  Administered 2019-03-11: 1000 mL via INTRAVENOUS

## 2019-03-11 MED ORDER — ONDANSETRON HCL 4 MG/2ML IJ SOLN
4.0000 mg | Freq: Four times a day (QID) | INTRAMUSCULAR | Status: DC | PRN
  Administered 2019-03-11 – 2019-03-13 (×3): 4 mg via INTRAVENOUS
  Filled 2019-03-11 (×3): qty 2

## 2019-03-11 MED ORDER — ACETAMINOPHEN 500 MG PO TABS
1000.0000 mg | ORAL_TABLET | Freq: Once | ORAL | Status: AC
  Administered 2019-03-11: 18:00:00 1000 mg via ORAL
  Filled 2019-03-11: qty 2

## 2019-03-11 MED ORDER — METRONIDAZOLE IN NACL 5-0.79 MG/ML-% IV SOLN
500.0000 mg | Freq: Once | INTRAVENOUS | Status: AC
  Administered 2019-03-11: 500 mg via INTRAVENOUS
  Filled 2019-03-11: qty 100

## 2019-03-11 MED ORDER — ACETAMINOPHEN 325 MG PO TABS
650.0000 mg | ORAL_TABLET | Freq: Four times a day (QID) | ORAL | Status: DC | PRN
  Administered 2019-03-11 – 2019-03-14 (×6): 650 mg via ORAL
  Filled 2019-03-11 (×6): qty 2

## 2019-03-11 MED ORDER — ENOXAPARIN SODIUM 40 MG/0.4ML ~~LOC~~ SOLN
40.0000 mg | SUBCUTANEOUS | Status: DC
  Administered 2019-03-12: 40 mg via SUBCUTANEOUS
  Filled 2019-03-11 (×2): qty 0.4

## 2019-03-11 MED ORDER — SODIUM CHLORIDE 0.9 % IV BOLUS
1000.0000 mL | Freq: Once | INTRAVENOUS | Status: AC
  Administered 2019-03-11: 20:00:00 1000 mL via INTRAVENOUS

## 2019-03-11 MED ORDER — MORPHINE SULFATE (PF) 4 MG/ML IV SOLN
4.0000 mg | Freq: Once | INTRAVENOUS | Status: AC
  Administered 2019-03-11: 18:00:00 4 mg via INTRAVENOUS
  Filled 2019-03-11: qty 1

## 2019-03-11 NOTE — H&P (Signed)
History and Physical    Delani Kohli MHW:[id number] DOB: 1970-06-18 DOA: 03/11/2019  PCP: Harlan Stains, MD Patient coming from: Home  Chief Complaint: Abdominal pain  HPI: Annette Thomas is a 48 y.o. female with medical history significant of ulcerative colitis, hypertension, fibroids recently tested positive for COVID-19 on 10/18 presenting with complaints of abdominal pain.  Patient reports 6-day history of bilateral lower quadrant abdominal pain.  She has been having multiple episodes of clay colored, nonbloody diarrhea.  She vomited once but continues to feel nauseous.  She has been having fevers for the past few days.  States she is seen by Dr. Trula Slade from [facility] for her ulcerative colitis.  States she tested positive for COVID-19 on 10/18 and her husband is currently admitted to [facility].  She is currently not having any respiratory complaints.  Denies shortness of breath or cough.  ED Course: Temperature 103.1 F, tachycardic.  White blood cell count 13.3.  Procalcitonin 0.62.  CRP 27.8.  Lactic acid x2 normal.  Potassium 3.1.  C. difficile PCR and GI pathogen panel pending.  UA not suggestive of infection.  Urine culture pending.  Blood culture x2 pending.  Chest x-ray showing no acute cardiopulmonary abnormality.  CT angiogram negative for PE and no acute pulmonary findings.  CT abdomen showing diffuse inflammatory process involving the colon most consistent with active ulcerative colitis.  No mass or obstruction. Patient received Tylenol, morphine, Zofran, potassium supplementation, ceftriaxone, Flagyl, and 2 L normal saline boluses.  Review of Systems:  All systems reviewed and apart from history of presenting illness, are negative.  History reviewed. No pertinent past medical history.  Past Surgical History:  Procedure Laterality Date   GALLBLADDER SURGERY     IR RADIOLOGIST EVAL & MGMT  03/28/2017     reports that she has never smoked. She has never used  smokeless tobacco. She reports current alcohol use. She reports that she does not use drugs.  No Known Allergies  Family History  Problem Relation Age of Onset   Heart attack Paternal Grandfather    Brain cancer Paternal Grandmother        tumor   Diabetes Maternal Grandmother    Heart attack Maternal Grandfather    Diabetes Maternal Grandfather    Heart attack Father    Diabetes Mother     Prior to Admission medications   Medication Sig Start Date End Date Taking? Authorizing Provider  amLODipine (NORVASC) 5 MG tablet Take 5 mg by mouth daily.   Yes [provider]  Cetirizine HCl 10 MG CAPS Take 1 capsule (10 mg total) by mouth daily for 10 days. 02/23/19 03/11/19 Yes Wieters, Hallie C, PA-C  dicyclomine (BENTYL) 20 MG tablet Take 20 mg by mouth every 6 (six) hours as needed.   Yes [provider]  fluticasone (FLONASE) 50 MCG/ACT nasal spray Place 1-2 sprays into both nostrils daily for 7 days. 02/23/19 03/11/19 Yes Wieters, Hallie C, PA-C  HYDROcodone-acetaminophen (NORCO/VICODIN) 5-325 MG tablet Take 1 tablet by mouth every 6 (six) hours as needed for severe pain. 04/25/18  Yes [provider]  mesalamine (LIALDA) 1.2 g EC tablet Take 2.4 g by mouth daily.  01/29/19  Yes [provider]  Multiple Vitamin (MULTIVITAMIN) tablet Take 1 tablet by mouth daily.   Yes [provider]  naproxen (NAPROSYN) 500 MG tablet Take 500 mg by mouth as needed.   Yes [provider]  VITAMIN D, ERGOCALCIFEROL, PO Take 5,000 Units by mouth daily.  Yes [provider]  hydrochlorothiazide (MICROZIDE) 12.5 MG capsule Take 1 capsule (12.5 mg total) by mouth daily. Patient not taking: Reported on 10/04/2015 08/04/11 02/23/19  Darlyne Russian, MD  magic mouthwash SOLN 1: 1 benadryl to maalox 10/04/15 02/23/19  Deno Etienne, DO    Physical Exam: Vitals:   03/11/19 2030 03/11/19 2100 03/11/19 2102 03/11/19 2115  BP: 122/70 114/69  134/70    Pulse: (!) 106 (!) 106    Resp:      Temp:   98.7 F (37.1 C)   TempSrc:   Oral   SpO2: 100% 99%    Weight:      Height:        Physical Exam  Constitutional: She is oriented to person, place, and time. She appears well-developed and well-nourished. She appears distressed.  Distressed secondary to pain  HENT:  Head: Normocephalic.  Eyes: Right eye exhibits no discharge. Left eye exhibits no discharge.  Neck: Neck supple.  Cardiovascular: Regular rhythm and intact distal pulses.  Tachycardic  Pulmonary/Chest: Effort normal and breath sounds normal. No respiratory distress. She has no wheezes. She has no rales.  Abdominal: Soft. Bowel sounds are normal. She exhibits no distension. There is abdominal tenderness. There is guarding. There is no rebound.  Bilateral lower quadrants tender to palpation with guarding  Musculoskeletal:        General: No edema.  Neurological: She is alert and oriented to person, place, and time.  Skin: Skin is warm and dry. She is not diaphoretic.     Labs on Admission: I have personally reviewed following labs and imaging studies  CBC: Recent Labs  Lab 03/08/19 2047 03/11/19 1422  WBC 9.1 13.3*  NEUTROABS 6.8 10.8*  HGB 13.2 12.9  HCT 39.7 38.9  MCV 83.6 82.4  PLT 314 643   Basic Metabolic Panel: Recent Labs  Lab 03/08/19 2047 03/11/19 1422  NA 137 138  K 3.0* 3.1*  CL 103 102  CO2 20* 22  GLUCOSE 151* 105*  BUN 8 6  CREATININE 1.00 1.03*  CALCIUM 9.5 9.1   GFR: Estimated Creatinine Clearance: 63.5 mL/min (A) (by C-G formula based on SCr of 1.03 mg/dL (H)). Liver Function Tests: Recent Labs  Lab 03/08/19 2047 03/11/19 1422  AST 26 18  ALT 29 23  ALKPHOS 78 79  BILITOT 0.8 1.1  PROT 7.3 7.1  ALBUMIN 3.8 3.5   No results for input(s): LIPASE, AMYLASE in the last 168 hours. No results for input(s): AMMONIA in the last 168 hours. Coagulation Profile: No results for input(s): INR, PROTIME in the last 168 hours. Cardiac  Enzymes: No results for input(s): CKTOTAL, CKMB, CKMBINDEX, TROPONINI in the last 168 hours. BNP (last 3 results) No results for input(s): PROBNP in the last 8760 hours. HbA1C: No results for input(s): HGBA1C in the last 72 hours. CBG: No results for input(s): GLUCAP in the last 168 hours. Lipid Profile: No results for input(s): CHOL, HDL, LDLCALC, TRIG, CHOLHDL, LDLDIRECT in the last 72 hours. Thyroid Function Tests: No results for input(s): TSH, T4TOTAL, FREET4, T3FREE, THYROIDAB in the last 72 hours. Anemia Panel: No results for input(s): VITAMINB12, FOLATE, FERRITIN, TIBC, IRON, RETICCTPCT in the last 72 hours. Urine analysis:    Component Value Date/Time   COLORURINE YELLOW 03/11/2019 2126   APPEARANCEUR CLEAR 03/11/2019 2126   LABSPEC 1.008 03/11/2019 2126   PHURINE 5.0 03/11/2019 2126   GLUCOSEU NEGATIVE 03/11/2019 2126   HGBUR NEGATIVE 03/11/2019 2126   BILIRUBINUR NEGATIVE 03/11/2019 2126  KETONESUR 20 (A) 03/11/2019 2126   PROTEINUR NEGATIVE 03/11/2019 2126   NITRITE NEGATIVE 03/11/2019 2126   LEUKOCYTESUR NEGATIVE 03/11/2019 2126    Radiological Exams on Admission: Ct Angio Chest Pe W/cm &/or Wo Cm  Result Date: 03/11/2019 CLINICAL DATA:  Inflammatory bowel disease. History of ulcerative colitis. Fever, tachycardia, diarrhea. Chest pain. EXAM: CT ANGIOGRAPHY CHEST CT ABDOMEN AND PELVIS WITH CONTRAST TECHNIQUE: Multidetector CT imaging of the chest was performed using the standard protocol during bolus administration of intravenous contrast. Multiplanar CT image reconstructions and MIPs were obtained to evaluate the vascular anatomy. Multidetector CT imaging of the abdomen and pelvis was performed using the standard protocol during bolus administration of intravenous contrast. CONTRAST:  175m OMNIPAQUE IOHEXOL 350 MG/ML SOLN COMPARISON:  CT scan 08/18/2017 FINDINGS: CTA CHEST FINDINGS Cardiovascular: The heart is normal in size. No pericardial effusion. The aorta is  normal in caliber. No dissection. No atherosclerotic calcifications. Branch vessels are patent. No coronary artery calcifications. The pulmonary arterial tree is fairly well opacified. No filling defects to suggest pulmonary embolism. Mediastinum/Nodes: No mediastinal or hilar mass or adenopathy. The esophagus is grossly normal. Lungs/Pleura: The lungs are clear. No infiltrates, edema or effusions. No worrisome pulmonary lesions. Musculoskeletal: No breast masses, supraclavicular or axillary adenopathy. The bony thorax is intact. Review of the MIP images confirms the above findings. CT ABDOMEN and PELVIS FINDINGS Hepatobiliary: Simple appearing hepatic cyst is noted. No worrisome hepatic lesions. The gallbladder surgically absent. Mild associated intra and extrahepatic biliary dilatation. Pancreas: No mass, inflammation or ductal dilatation. Spleen: Normal size.  No focal lesions. Adrenals/Urinary Tract: The adrenal glands and kidneys are unremarkable. The bladder appears normal. Stomach/Bowel: The stomach, duodenum and small bowel are unremarkable. No acute inflammatory changes or obstructive findings. Diffuse inflammation of the colon is noted consistent with known diagnosis of ulcerative colitis. Marked wall thickening with mucosal and serosal enhancement but no obstructive findings or mass lesions. The appendix is normal. Vascular/Lymphatic: The aorta is normal in caliber. No dissection. The branch vessels are patent. The major venous structures are patent. No mesenteric or retroperitoneal mass or adenopathy. Small scattered lymph nodes are noted. Reproductive: Stable markedly enlarged fibroid uterus. The ovaries appear normal. Other: No abdominal or pelvic abscess or free air. Musculoskeletal: No significant bony findings. Review of the MIP images confirms the above findings. IMPRESSION: 1. No CT findings for pulmonary embolism. 2. Normal thoracic aorta. 3. No acute pulmonary findings. 4. Diffuse inflammatory  process involving the colon most consistent with active ulcerative colitis. No mass or obstruction. 5. Stable enlarged fibroid uterus. Electronically Signed   By: PMarijo SanesM.D.   On: 03/11/2019 21:55   Ct Abdomen Pelvis W Contrast  Result Date: 03/11/2019 CLINICAL DATA:  Inflammatory bowel disease. History of ulcerative colitis. Fever, tachycardia, diarrhea. Chest pain. EXAM: CT ANGIOGRAPHY CHEST CT ABDOMEN AND PELVIS WITH CONTRAST TECHNIQUE: Multidetector CT imaging of the chest was performed using the standard protocol during bolus administration of intravenous contrast. Multiplanar CT image reconstructions and MIPs were obtained to evaluate the vascular anatomy. Multidetector CT imaging of the abdomen and pelvis was performed using the standard protocol during bolus administration of intravenous contrast. CONTRAST:  1021mOMNIPAQUE IOHEXOL 350 MG/ML SOLN COMPARISON:  CT scan 08/18/2017 FINDINGS: CTA CHEST FINDINGS Cardiovascular: The heart is normal in size. No pericardial effusion. The aorta is normal in caliber. No dissection. No atherosclerotic calcifications. Branch vessels are patent. No coronary artery calcifications. The pulmonary arterial tree is fairly well opacified. No filling defects to suggest  pulmonary embolism. Mediastinum/Nodes: No mediastinal or hilar mass or adenopathy. The esophagus is grossly normal. Lungs/Pleura: The lungs are clear. No infiltrates, edema or effusions. No worrisome pulmonary lesions. Musculoskeletal: No breast masses, supraclavicular or axillary adenopathy. The bony thorax is intact. Review of the MIP images confirms the above findings. CT ABDOMEN and PELVIS FINDINGS Hepatobiliary: Simple appearing hepatic cyst is noted. No worrisome hepatic lesions. The gallbladder surgically absent. Mild associated intra and extrahepatic biliary dilatation. Pancreas: No mass, inflammation or ductal dilatation. Spleen: Normal size.  No focal lesions. Adrenals/Urinary Tract: The  adrenal glands and kidneys are unremarkable. The bladder appears normal. Stomach/Bowel: The stomach, duodenum and small bowel are unremarkable. No acute inflammatory changes or obstructive findings. Diffuse inflammation of the colon is noted consistent with known diagnosis of ulcerative colitis. Marked wall thickening with mucosal and serosal enhancement but no obstructive findings or mass lesions. The appendix is normal. Vascular/Lymphatic: The aorta is normal in caliber. No dissection. The branch vessels are patent. The major venous structures are patent. No mesenteric or retroperitoneal mass or adenopathy. Small scattered lymph nodes are noted. Reproductive: Stable markedly enlarged fibroid uterus. The ovaries appear normal. Other: No abdominal or pelvic abscess or free air. Musculoskeletal: No significant bony findings. Review of the MIP images confirms the above findings. IMPRESSION: 1. No CT findings for pulmonary embolism. 2. Normal thoracic aorta. 3. No acute pulmonary findings. 4. Diffuse inflammatory process involving the colon most consistent with active ulcerative colitis. No mass or obstruction. 5. Stable enlarged fibroid uterus. Electronically Signed   By: Marijo Sanes M.D.   On: 03/11/2019 21:55   Dg Chest Port 1 View  Result Date: 03/11/2019 CLINICAL DATA:  COVID-19 positive 02/23/2019 EXAM: PORTABLE CHEST 1 VIEW COMPARISON:  None. FINDINGS: No consolidation, features of edema, pneumothorax, or effusion. Pulmonary vascularity is normally distributed. The cardiomediastinal contours are unremarkable. No acute osseous or soft tissue abnormality. IMPRESSION: No acute cardiopulmonary abnormality. Electronically Signed   By: Lovena Le M.D.   On: 03/11/2019 18:08    EKG: Independently reviewed.  Sinus tachycardia, heart rate 123.  Assessment/Plan Principal Problem:   Exacerbation of ulcerative colitis ([place]) Active Problems:   Hypokalemia   Lab test positive for detection of COVID-19  virus   Sepsis ([facility])   HTN (hypertension)   Sepsis secondary to acute exacerbation of ulcerative colitis Febrile and tachycardic.  White blood cell count mildly elevated.  Lactic acid checked twice normal.  Procalcitonin elevated at 0.62.  CRP significantly elevated at 27.8. CT abdomen showing diffuse inflammatory process involving the colon most consistent with active ulcerative colitis.  Currently on mesalamine and followed by [facility]. -ED provider discussed the case with [facility] on-call physician.  Will consult in a.m. No steroids recommended at this time. -Received 2 L IV fluid boluses.  Continue IV fluid hydration. -Received ceftriaxone and Flagyl.  Continue antibiotics. -Morphine as needed for pain -Tylenol as needed -Zofran as needed for nausea/vomiting -Continue to monitor white blood cell count -C. difficile PCR and GI pathogen panel pending -Blood culture x2 pending  Mild hypokalemia Likely secondary to GI losses from vomiting/diarrhea.  Potassium 3.1.   -Replete potassium.  Check magnesium level and replete if low.  Continue to monitor electrolytes.  Recent COVID-19 viral infection Patient reports testing positive for COVID-19 on 10/18.  Currently no respiratory symptoms.  No signs of respiratory distress.  CT angiogram without acute pulmonary findings. -Repeat SARS-CoV-2 test pending.  If still positive, please ask ID in the morning how long  isolation needs to be continued.  She is already past 14 days from the day she tested positive.  Until then, airborne and contact precautions will be continued.  Hypertension -Currently normotensive.  Hold antihypertensives in the setting of sepsis.  HIV screening The patient falls between the ages of 13-64 and should be screened for HIV, therefore HIV testing ordered.  DVT prophylaxis: Lovenox Code Status: Full code Family Communication: No family available. Disposition Plan: Anticipate discharge after clinical  improvement. Consults called: [facility] Admission status: It is my clinical opinion that admission to INPATIENT is reasonable and necessary in this 48 y.o. female  presenting with sepsis secondary to acute exacerbation of ulcerative colitis.  Will require IV antibiotics and IV fluid.  GI will consult in a.m.  Given the aforementioned, the predictability of an adverse outcome is felt to be significant. I expect that the patient will require at least 2 midnights in the hospital to treat this condition.   The medical decision making on this patient was of high complexity and the patient is at high risk for clinical deterioration, therefore this is a level 3 visit.  Shela Leff MD [facility] Pager [phone]  If 7PM-7AM, please contact night-coverage [email or web address] Password [facility]  03/11/2019, 11:37 PM

## 2019-03-11 NOTE — ED Provider Notes (Signed)
[facility] EMERGENCY DEPARTMENT Provider Note   CSN: [id number] Arrival date & time: 03/11/19  1309     History   Chief Complaint Chief Complaint  Patient presents with   Abdominal Pain   Covid +    HPI Annette Thomas is a 48 y.o. female with a past medical history of ulcerative colitis, hypertension, fibroids, who presents today for evaluation of multiple complaints. 1.  Covid positive: She reports that on 02/22/2019 she began developing ingestion, rhinorrhea, and infrequent cough along with chills, body aches.  Her husband was diagnosed with Covid.  Swab obtained that day came back positive for Covid.  She reports that she is currently not having significant cough.  She does report fevers.  She denies significant shortness of breath.  She reports myalgias and headache.  She has been taking Tylenol at home, last Tylenol was at about 10 AM.  She reports that her husband is currently hospitalized at [facility] for coronavirus.  2.  Abdominal pain: She has a history of ulcerative colitis.  Primary GI is [facility]. Trula Slade she reports that over the past week she has had worsening lower abdominal pain and diarrhea.  She reports that typically when she has a UC flare her pain is in her lower abdomen and feels consistent with this.  She has been in contact over the phone with her GI doctor who wished to test her for C. difficile as she has reportedly been having about 7 nonbloody episodes of diarrhea a day which is atypical for her.  She denies any upper abdominal pain.  She reports intermittent nausea however no vomiting.  She denies any trauma. She denies any abnormal vaginal discharge or bleeding.  No dysuria, increased frequency or urgency.  She is scheduled for CT scan however reports that that is not until the 14th. She reports that she comes in today because her symptoms including her pain has become significantly worse.       HPI  History reviewed. No pertinent  past medical history.  Patient Active Problem List   Diagnosis Date Noted   Exacerbation of ulcerative colitis ([place]) 03/11/2019   Fibroid 09/05/2011    Past Surgical History:  Procedure Laterality Date   GALLBLADDER SURGERY     IR RADIOLOGIST EVAL & MGMT  03/28/2017     OB History    Gravida  2   Para  2   Term  2   Preterm      AB      Living  2     SAB      TAB      Ectopic      Multiple      Live Births               Home Medications    Prior to Admission medications   Medication Sig Start Date End Date Taking? Authorizing Provider  amLODipine (NORVASC) 5 MG tablet Take 5 mg by mouth daily.   Yes [provider]  Cetirizine HCl 10 MG CAPS Take 1 capsule (10 mg total) by mouth daily for 10 days. 02/23/19 03/11/19 Yes Wieters, Hallie C, PA-C  dicyclomine (BENTYL) 20 MG tablet Take 20 mg by mouth every 6 (six) hours as needed.   Yes [provider]  fluticasone (FLONASE) 50 MCG/ACT nasal spray Place 1-2 sprays into both nostrils daily for 7 days. 02/23/19 03/11/19 Yes Wieters, Hallie C, PA-C  HYDROcodone-acetaminophen (NORCO/VICODIN) 5-325 MG tablet Take 1 tablet by  mouth every 6 (six) hours as needed for severe pain. 04/25/18  Yes [provider]  mesalamine (LIALDA) 1.2 g EC tablet Take 2.4 g by mouth daily.  01/29/19  Yes [provider]  Multiple Vitamin (MULTIVITAMIN) tablet Take 1 tablet by mouth daily.   Yes [provider]  naproxen (NAPROSYN) 500 MG tablet Take 500 mg by mouth as needed.   Yes [provider]  VITAMIN D, ERGOCALCIFEROL, PO Take 5,000 Units by mouth daily.   Yes [provider]  hydrochlorothiazide (MICROZIDE) 12.5 MG capsule Take 1 capsule (12.5 mg total) by mouth daily. Patient not taking: Reported on 10/04/2015 08/04/11 02/23/19  Darlyne Russian, MD  magic mouthwash SOLN 1: 1 benadryl to maalox 10/04/15 02/23/19  Deno Etienne, DO    Family History Family History    Problem Relation Age of Onset   Heart attack Paternal Grandfather    Brain cancer Paternal Grandmother        tumor   Diabetes Maternal Grandmother    Heart attack Maternal Grandfather    Diabetes Maternal Grandfather    Heart attack Father    Diabetes Mother     Social History Social History   Tobacco Use   Smoking status: Never Smoker   Smokeless tobacco: Never Used  Substance Use Topics   Alcohol use: Yes    Comment: social   Drug use: No     Allergies   Patient has no known allergies.   Review of Systems Review of Systems  Constitutional: Positive for appetite change, chills, fatigue and fever.  HENT: Negative for congestion.   Respiratory: Positive for chest tightness. Negative for cough and shortness of breath.   Cardiovascular: Positive for chest pain. Negative for palpitations and leg swelling.  Gastrointestinal: Positive for abdominal pain, diarrhea and nausea. Negative for anal bleeding, blood in stool, rectal pain and vomiting.  Genitourinary: Negative for dysuria, frequency and urgency.  Musculoskeletal: Negative for back pain and myalgias.  Skin: Negative for color change and rash.  Neurological: Positive for headaches. Negative for weakness.  Psychiatric/Behavioral: Negative for confusion.  All other systems reviewed and are negative.    Physical Exam Updated Vital Signs BP 134/70    Pulse (!) 106    Temp 98.7 F (37.1 C) (Oral)    Resp (!) 25    Ht 5' 7"  (1.702 m)    Wt 68.9 kg    SpO2 99%    BMI 23.81 kg/m   Physical Exam Vitals signs and nursing note reviewed.  Constitutional:      Appearance: She is well-developed. She is ill-appearing. She is not diaphoretic.  HENT:     Head: Normocephalic and atraumatic.     Mouth/Throat:     Mouth: Mucous membranes are moist.  Eyes:     General: No scleral icterus.       Right eye: No discharge.        Left eye: No discharge.     Conjunctiva/sclera: Conjunctivae normal.  Neck:      Musculoskeletal: Normal range of motion.  Cardiovascular:     Rate and Rhythm: Regular rhythm. Tachycardia present.     Heart sounds: No murmur.  Pulmonary:     Effort: Pulmonary effort is normal. No respiratory distress.     Breath sounds: No stridor.  Abdominal:     General: There is no distension.     Palpations: Abdomen is soft.     Tenderness: There is generalized abdominal tenderness and tenderness in  the right lower quadrant, periumbilical area, suprapubic area and left lower quadrant.  Musculoskeletal:        General: No deformity.  Skin:    General: Skin is warm and dry.  Neurological:     Mental Status: She is alert.     Motor: No abnormal muscle tone.  Psychiatric:        Behavior: Behavior normal.      ED Treatments / Results  Labs (all labs ordered are listed, but only abnormal results are displayed) Labs Reviewed  COMPREHENSIVE METABOLIC PANEL - Abnormal; Notable for the following components:      Result Value   Potassium 3.1 (*)    Glucose, Bld 105 (*)    Creatinine, Ser 1.03 (*)    All other components within normal limits  CBC WITH DIFFERENTIAL/PLATELET - Abnormal; Notable for the following components:   WBC 13.3 (*)    Neutro Abs 10.8 (*)    All other components within normal limits  C-REACTIVE PROTEIN - Abnormal; Notable for the following components:   CRP 27.8 (*)    All other components within normal limits  URINALYSIS, ROUTINE W REFLEX MICROSCOPIC - Abnormal; Notable for the following components:   Ketones, ur 20 (*)    All other components within normal limits  GASTROINTESTINAL PANEL BY PCR, STOOL (REPLACES STOOL CULTURE)  C DIFFICILE QUICK SCREEN W PCR REFLEX  CULTURE, BLOOD (ROUTINE X 2)  CULTURE, BLOOD (ROUTINE X 2)  URINE CULTURE  LACTIC ACID, PLASMA  LACTIC ACID, PLASMA  PROCALCITONIN  LACTIC ACID, PLASMA  LACTIC ACID, PLASMA  I-STAT BETA HCG BLOOD, ED ([facility], [facility], [facility] ONLY)    EKG EKG Interpretation  Date/Time:  Tuesday March 11 2019 13:50:17 EST Ventricular Rate:  123 PR Interval:  166 QRS Duration: 70 QT Interval:  288 QTC Calculation: 412 R Axis:   75 Text Interpretation: Sinus tachycardia Right atrial enlargement T wave abnormality, consider inferior ischemia Abnormal ECG No previous ECGs available Confirmed by Wandra Arthurs [phone]) on 03/11/2019 8:35:56 PM   Radiology Ct Angio Chest Pe W/cm &/or Wo Cm  Result Date: 03/11/2019 CLINICAL DATA:  Inflammatory bowel disease. History of ulcerative colitis. Fever, tachycardia, diarrhea. Chest pain. EXAM: CT ANGIOGRAPHY CHEST CT ABDOMEN AND PELVIS WITH CONTRAST TECHNIQUE: Multidetector CT imaging of the chest was performed using the standard protocol during bolus administration of intravenous contrast. Multiplanar CT image reconstructions and MIPs were obtained to evaluate the vascular anatomy. Multidetector CT imaging of the abdomen and pelvis was performed using the standard protocol during bolus administration of intravenous contrast. CONTRAST:  162m OMNIPAQUE IOHEXOL 350 MG/ML SOLN COMPARISON:  CT scan 08/18/2017 FINDINGS: CTA CHEST FINDINGS Cardiovascular: The heart is normal in size. No pericardial effusion. The aorta is normal in caliber. No dissection. No atherosclerotic calcifications. Branch vessels are patent. No coronary artery calcifications. The pulmonary arterial tree is fairly well opacified. No filling defects to suggest pulmonary embolism. Mediastinum/Nodes: No mediastinal or hilar mass or adenopathy. The esophagus is grossly normal. Lungs/Pleura: The lungs are clear. No infiltrates, edema or effusions. No worrisome pulmonary lesions. Musculoskeletal: No breast masses, supraclavicular or axillary adenopathy. The bony thorax is intact. Review of the MIP images confirms the above findings. CT ABDOMEN and PELVIS FINDINGS Hepatobiliary: Simple appearing hepatic cyst is noted. No worrisome hepatic lesions. The gallbladder surgically absent. Mild associated intra and  extrahepatic biliary dilatation. Pancreas: No mass, inflammation or ductal dilatation. Spleen: Normal size.  No focal lesions. Adrenals/Urinary Tract: The adrenal glands and kidneys  are unremarkable. The bladder appears normal. Stomach/Bowel: The stomach, duodenum and small bowel are unremarkable. No acute inflammatory changes or obstructive findings. Diffuse inflammation of the colon is noted consistent with known diagnosis of ulcerative colitis. Marked wall thickening with mucosal and serosal enhancement but no obstructive findings or mass lesions. The appendix is normal. Vascular/Lymphatic: The aorta is normal in caliber. No dissection. The branch vessels are patent. The major venous structures are patent. No mesenteric or retroperitoneal mass or adenopathy. Small scattered lymph nodes are noted. Reproductive: Stable markedly enlarged fibroid uterus. The ovaries appear normal. Other: No abdominal or pelvic abscess or free air. Musculoskeletal: No significant bony findings. Review of the MIP images confirms the above findings. IMPRESSION: 1. No CT findings for pulmonary embolism. 2. Normal thoracic aorta. 3. No acute pulmonary findings. 4. Diffuse inflammatory process involving the colon most consistent with active ulcerative colitis. No mass or obstruction. 5. Stable enlarged fibroid uterus. Electronically Signed   By: Marijo Sanes M.D.   On: 03/11/2019 21:55   Ct Abdomen Pelvis W Contrast  Result Date: 03/11/2019 CLINICAL DATA:  Inflammatory bowel disease. History of ulcerative colitis. Fever, tachycardia, diarrhea. Chest pain. EXAM: CT ANGIOGRAPHY CHEST CT ABDOMEN AND PELVIS WITH CONTRAST TECHNIQUE: Multidetector CT imaging of the chest was performed using the standard protocol during bolus administration of intravenous contrast. Multiplanar CT image reconstructions and MIPs were obtained to evaluate the vascular anatomy. Multidetector CT imaging of the abdomen and pelvis was performed using the standard  protocol during bolus administration of intravenous contrast. CONTRAST:  114m OMNIPAQUE IOHEXOL 350 MG/ML SOLN COMPARISON:  CT scan 08/18/2017 FINDINGS: CTA CHEST FINDINGS Cardiovascular: The heart is normal in size. No pericardial effusion. The aorta is normal in caliber. No dissection. No atherosclerotic calcifications. Branch vessels are patent. No coronary artery calcifications. The pulmonary arterial tree is fairly well opacified. No filling defects to suggest pulmonary embolism. Mediastinum/Nodes: No mediastinal or hilar mass or adenopathy. The esophagus is grossly normal. Lungs/Pleura: The lungs are clear. No infiltrates, edema or effusions. No worrisome pulmonary lesions. Musculoskeletal: No breast masses, supraclavicular or axillary adenopathy. The bony thorax is intact. Review of the MIP images confirms the above findings. CT ABDOMEN and PELVIS FINDINGS Hepatobiliary: Simple appearing hepatic cyst is noted. No worrisome hepatic lesions. The gallbladder surgically absent. Mild associated intra and extrahepatic biliary dilatation. Pancreas: No mass, inflammation or ductal dilatation. Spleen: Normal size.  No focal lesions. Adrenals/Urinary Tract: The adrenal glands and kidneys are unremarkable. The bladder appears normal. Stomach/Bowel: The stomach, duodenum and small bowel are unremarkable. No acute inflammatory changes or obstructive findings. Diffuse inflammation of the colon is noted consistent with known diagnosis of ulcerative colitis. Marked wall thickening with mucosal and serosal enhancement but no obstructive findings or mass lesions. The appendix is normal. Vascular/Lymphatic: The aorta is normal in caliber. No dissection. The branch vessels are patent. The major venous structures are patent. No mesenteric or retroperitoneal mass or adenopathy. Small scattered lymph nodes are noted. Reproductive: Stable markedly enlarged fibroid uterus. The ovaries appear normal. Other: No abdominal or pelvic  abscess or free air. Musculoskeletal: No significant bony findings. Review of the MIP images confirms the above findings. IMPRESSION: 1. No CT findings for pulmonary embolism. 2. Normal thoracic aorta. 3. No acute pulmonary findings. 4. Diffuse inflammatory process involving the colon most consistent with active ulcerative colitis. No mass or obstruction. 5. Stable enlarged fibroid uterus. Electronically Signed   By: PMarijo SanesM.D.   On: 03/11/2019 21:55   Dg Chest  Port 1 View  Result Date: 03/11/2019 CLINICAL DATA:  COVID-19 positive 02/23/2019 EXAM: PORTABLE CHEST 1 VIEW COMPARISON:  None. FINDINGS: No consolidation, features of edema, pneumothorax, or effusion. Pulmonary vascularity is normally distributed. The cardiomediastinal contours are unremarkable. No acute osseous or soft tissue abnormality. IMPRESSION: No acute cardiopulmonary abnormality. Electronically Signed   By: Lovena Le M.D.   On: 03/11/2019 18:08    Procedures .Critical Care Performed by: Lorin Glass, PA-C Authorized by: Lorin Glass, PA-C   Critical care provider statement:    Critical care time (minutes):  45   Critical care was necessary to treat or prevent imminent or life-threatening deterioration of the following conditions:  Sepsis and shock   Critical care was time spent personally by me on the following activities:  Discussions with consultants, evaluation of patient's response to treatment, examination of patient, ordering and performing treatments and interventions, ordering and review of laboratory studies, ordering and review of radiographic studies, pulse oximetry, re-evaluation of patient's condition, obtaining history from patient or surrogate and review of old charts   (including critical care time)  Medications Ordered in ED Medications  0.9 %  sodium chloride infusion (has no administration in time range)  sodium chloride 0.9 % bolus 1,000 mL (0 mLs Intravenous Stopped 03/11/19 2017)   morphine 4 MG/ML injection 4 mg (4 mg Intravenous Given 03/11/19 1826)  ondansetron (ZOFRAN) injection 4 mg (4 mg Intravenous Given 03/11/19 1827)  acetaminophen (TYLENOL) tablet 1,000 mg (1,000 mg Oral Given 03/11/19 1828)  potassium chloride SA (KLOR-CON) CR tablet 40 mEq (40 mEq Oral Given 03/11/19 1829)  cefTRIAXone (ROCEPHIN) 2 g in sodium chloride 0.9 % 100 mL IVPB (0 g Intravenous Stopped 03/11/19 2102)  metroNIDAZOLE (FLAGYL) IVPB 500 mg (0 mg Intravenous Stopped 03/11/19 2147)  sodium chloride 0.9 % bolus 1,000 mL (1,000 mLs Intravenous New Bag/Given 03/11/19 2017)  iohexol (OMNIPAQUE) 350 MG/ML injection 100 mL (100 mLs Intravenous Contrast Given 03/11/19 2135)     Initial Impression / Assessment and Plan / ED Course  I have reviewed the triage vital signs and the nursing notes.  Pertinent labs & imaging results that were available during my care of the patient were reviewed by me and considered in my medical decision making (see chart for details).  Clinical Course as of Mar 10 2305  Tue Mar 11, 2019  2218 I spoke with on call for [facility] Dr. Therisa Doyne who reccomends holding steroids until results of c.diff and GI panel   [EH]  2255 Spoke with hosptialist who will admit the patient.    [EH]    Clinical Course User Index [EH] Lorin Glass, PA-C      Patient presents today for evaluation of 2 complaints. 1.  Abdominal pain/UC flare: She has had multiple episodes of worsening diarrhea and lower abdominal pain over the past 4 days.  She has been in contact with her GI doctor who is Dr. Trula Slade with [facility]. Labs are obtained and reviewed she has a leukocytosis at 13.3 with neutrophils of 10.8.  Potassium is low at 3.1, orally repleted.  CT scan abdomen pelvis was obtained showing diffuse inflammatory process involving the colon most consistent with acute ulcerative colitis.  I spoke with on-call GI Dr. [facility] who recommends holding steroids until the results of the C.  difficile and GI panel return.  Patient was given IV fluids for dehydration given her large amounts of diarrhea.  While in the ER she was tachycardic, febrile  and tachypnic and meet sepsis criteria for a suspected GI source.  She is treated with flagyl and rocephin along with tylenol, zofran, and 2 liters of IVF and morphine.    2.  Covid: Patient tested positive for covid on the 18th of last month.  CT chest PE study was obtained with out evidence of lung abnormalities or PE.  I suspect patient's primary symptoms are related to her UC flair.  Repeat covid testing was sent for admission.   Patient's HR improved after fluids.    I spoke with on call [facility]. Dr. Therisa Doyne who recommended holding steroids until C. difficile and GI pathogen panel result.  This patient was seen as a shared visit with Dr. Darl Householder.    Patient will be admitted to the hospital.     Final Clinical Impressions(s) / ED Diagnoses   Final diagnoses:  Ulcerative pancolitis without complication ([place])  [id number] virus infection    ED Discharge Orders    None       Ollen Gross 03/11/19 2322    Drenda Freeze, MD 03/15/19 1201

## 2019-03-11 NOTE — ED Triage Notes (Signed)
Patient states she was tested positive for Covid 10/18 and seen here on 31st for abdominal pain. Patient has been back and forth between GI, PCP, walk in clinic and ED for ongoing abdominal pain related to her ulcerative colitis. States she was given some fluids and told to alternate between advil and tylenol. Patient saying it is not controlling her fevers or pain. Requesting CT scan and prednisone. Adds that she dropped off stool specimen for testing earlier today after having clay water color stool. Patient adds that she is anxious due to being home alone while her husband is admitted to [facility] and her kids are away at school.

## 2019-03-11 NOTE — ED Notes (Signed)
Pt called wanting tylenol said shes in a lot of pain

## 2019-03-12 DIAGNOSIS — E876 Hypokalemia: Secondary | ICD-10-CM

## 2019-03-12 DIAGNOSIS — U071 COVID-19: Secondary | ICD-10-CM

## 2019-03-12 LAB — CBC
HCT: 30.9 % — ABNORMAL LOW (ref 36.0–46.0)
Hemoglobin: 10.5 g/dL — ABNORMAL LOW (ref 12.0–15.0)
MCH: 28.1 pg (ref 26.0–34.0)
MCHC: 34 g/dL (ref 30.0–36.0)
MCV: 82.6 fL (ref 80.0–100.0)
Platelets: 263 10*3/uL (ref 150–400)
RBC: 3.74 MIL/uL — ABNORMAL LOW (ref 3.87–5.11)
RDW: 14 % (ref 11.5–15.5)
WBC: 10.5 10*3/uL (ref 4.0–10.5)
nRBC: 0 % (ref 0.0–0.2)

## 2019-03-12 LAB — GASTROINTESTINAL PANEL BY PCR, STOOL (REPLACES STOOL CULTURE)

## 2019-03-12 LAB — BASIC METABOLIC PANEL
Anion gap: 10 (ref 5–15)
BUN: 6 mg/dL (ref 6–20)
CO2: 17 mmol/L — ABNORMAL LOW (ref 22–32)
Calcium: 7.9 mg/dL — ABNORMAL LOW (ref 8.9–10.3)
Chloride: 111 mmol/L (ref 98–111)
Creatinine, Ser: 0.92 mg/dL (ref 0.44–1.00)
GFR calc Af Amer: >60 mL/min (ref >60)
GFR calc non Af Amer: >60 mL/min (ref >60)
Glucose, Bld: 121 mg/dL — ABNORMAL HIGH (ref 70–99)
Potassium: 2.9 mmol/L — ABNORMAL LOW (ref 3.5–5.1)
Sodium: 138 mmol/L (ref 135–145)

## 2019-03-12 LAB — C DIFFICILE QUICK SCREEN W PCR REFLEX
C Diff antigen: NEGATIVE
C Diff interpretation: NOT DETECTED
C Diff toxin: NEGATIVE

## 2019-03-12 LAB — URINE CULTURE: Culture: NO GROWTH

## 2019-03-12 LAB — HIV ANTIBODY (ROUTINE TESTING W REFLEX): HIV Screen 4th Generation wRfx: NONREACTIVE

## 2019-03-12 LAB — MAGNESIUM: Magnesium: 2 mg/dL (ref 1.7–2.4)

## 2019-03-12 LAB — SARS CORONAVIRUS 2 (TAT 6-24 HRS): SARS Coronavirus 2: POSITIVE — AB

## 2019-03-12 MED ORDER — POTASSIUM CHLORIDE CRYS ER 20 MEQ PO TBCR
40.0000 meq | EXTENDED_RELEASE_TABLET | Freq: Two times a day (BID) | ORAL | Status: AC
  Administered 2019-03-12 (×2): 40 meq via ORAL
  Filled 2019-03-12 (×2): qty 2

## 2019-03-12 MED ORDER — METHYLPREDNISOLONE SODIUM SUCC 40 MG IJ SOLR
40.0000 mg | Freq: Two times a day (BID) | INTRAMUSCULAR | Status: DC
  Administered 2019-03-12 – 2019-03-14 (×4): 40 mg via INTRAVENOUS
  Filled 2019-03-12 (×5): qty 1

## 2019-03-12 NOTE — Progress Notes (Signed)
PROGRESS NOTE    Annette Thomas  SVX:[id number] DOB: Jan 24, 1971 DOA: 03/11/2019 PCP: Harlan Stains, MD    Brief Narrative:  48 y.o. female with medical history significant of ulcerative colitis, hypertension, fibroids recently tested positive for COVID-19 on 10/18 presenting with complaints of abdominal pain.  Patient reports 6-day history of bilateral lower quadrant abdominal pain.  She has been having multiple episodes of clay colored, nonbloody diarrhea.  She vomited once but continues to feel nauseous.  She has been having fevers for the past few days.  States she is seen by Dr. Trula Slade from [facility] for her ulcerative colitis.  States she tested positive for COVID-19 on 10/18 and her husband is currently admitted to [place].  She is currently not having any respiratory complaints.  Denies shortness of breath or cough.  ED Course: Temperature 103.1 F, tachycardic.  White blood cell count 13.3.  Procalcitonin 0.62.  CRP 27.8.  Lactic acid x2 normal.  Potassium 3.1.  C. difficile PCR and GI pathogen panel pending.  UA not suggestive of infection.  Urine culture pending.  Blood culture x2 pending.  Chest x-ray showing no acute cardiopulmonary abnormality.  CT angiogram negative for PE and no acute pulmonary findings.  CT abdomen showing diffuse inflammatory process involving the colon most consistent with active ulcerative colitis.  No mass or obstruction. Patient received Tylenol, morphine, Zofran, potassium supplementation, ceftriaxone, Flagyl, and 2 L normal saline boluses.   Assessment & Plan:   Principal Problem:   Exacerbation of ulcerative colitis ([place]) Active Problems:   Hypokalemia   Lab test positive for detection of COVID-19 virus   Sepsis (HCC)   HTN (hypertension)    Sepsis secondary to acute exacerbation of ulcerative colitis -Pt presented febrile and tachycardic.   -White blood cell count mildly elevated at presentation -CT abdomen showing diffuse  inflammatory process involving the colon most consistent with active ulcerative colitis.   -GI consulted with recommendations for continued IV solumedrol, Lialda with rocephin and flagyl -This AM, pt reports symptoms seem improved -Advancing diet -Cdiff neg. Stool studies pending  Mild hypokalemia -Likely secondary to GI losses from vomiting/diarrhea.   -Replaced -Repeat BMET in AM and cont to replace as needed.    Recent COVID-19 viral infection -Patient reports testing positive for COVID-19 on 10/18.   -Currently no respiratory symptoms and no signs of respiratory distress.   -CT angiogram without acute pulmonary findings, reviewed -Repeat SARS-CoV-2 test is pos  Hypertension -Currently normotensive.   -Held antihypertensives in the setting of presenting sepsis.  HIV screening -HIV is neg  DVT prophylaxis: Lovenox subQ Code Status: Full Family Communication: Pt in room, family not at bedside Disposition Plan: Uncertain at this time  Consultants:   GI  Procedures:     Antimicrobials: Anti-infectives (From admission, onward)   Start     Dose/Rate Route Frequency Ordered Stop   03/12/19 1900  cefTRIAXone (ROCEPHIN) 2 g in sodium chloride 0.9 % 100 mL IVPB     2 g 200 mL/hr over 30 Minutes Intravenous Every 24 hours 03/11/19 2327     03/12/19 0300  metroNIDAZOLE (FLAGYL) IVPB 500 mg     500 mg 100 mL/hr over 60 Minutes Intravenous Every 8 hours 03/11/19 2327     03/11/19 1930  cefTRIAXone (ROCEPHIN) 2 g in sodium chloride 0.9 % 100 mL IVPB     2 g 200 mL/hr over 30 Minutes Intravenous  Once 03/11/19 1926 03/11/19 2102   03/11/19 1930  metroNIDAZOLE (FLAGYL) IVPB  500 mg     500 mg 100 mL/hr over 60 Minutes Intravenous  Once 03/11/19 1926 03/11/19 2147       Subjective: Reports feeling better since receiving IVF and abx overnight  Objective: Vitals:   03/12/19 1130 03/12/19 1145 03/12/19 1200 03/12/19 1300  BP: 115/76 120/72 126/74 124/73  Pulse: 88 92  94 98  Resp: 20 (!) 23 (!) 27 16  Temp:      TempSrc:      SpO2: 100% 99% 100% 100%  Weight:      Height:       No intake or output data in the 24 hours ending 03/12/19 1429 Filed Weights   03/11/19 1359  Weight: 68.9 kg    Examination:  General exam: Appears calm and comfortable  Respiratory system: No audible wheezing. Respiratory effort normal. Cardiovascular system: regular, perfused Gastrointestinal system: nondistended, pos bs Central nervous system: Alert and oriented. No focal neurological deficits. Extremities: Symmetric 5 x 5 power. Skin: No rashes, lesions Psychiatry: Judgement and insight appear normal. Mood & affect appropriate.   Data Reviewed: I have personally reviewed following labs and imaging studies  CBC: Recent Labs  Lab 03/08/19 2047 03/11/19 1422 03/12/19 0212  WBC 9.1 13.3* 10.5  NEUTROABS 6.8 10.8*  --   HGB 13.2 12.9 10.5*  HCT 39.7 38.9 30.9*  MCV 83.6 82.4 82.6  PLT 314 330 902   Basic Metabolic Panel: Recent Labs  Lab 03/08/19 2047 03/11/19 1422 03/11/19 2326 03/12/19 0212  NA 137 138  --  138  K 3.0* 3.1*  --  2.9*  CL 103 102  --  111  CO2 20* 22  --  17*  GLUCOSE 151* 105*  --  121*  BUN 8 6  --  6  CREATININE 1.00 1.03*  --  0.92  CALCIUM 9.5 9.1  --  7.9*  MG  --   --  2.0  --    GFR: Estimated Creatinine Clearance: 71.1 mL/min (by C-G formula based on SCr of 0.92 mg/dL). Liver Function Tests: Recent Labs  Lab 03/08/19 2047 03/11/19 1422  AST 26 18  ALT 29 23  ALKPHOS 78 79  BILITOT 0.8 1.1  PROT 7.3 7.1  ALBUMIN 3.8 3.5   No results for input(s): LIPASE, AMYLASE in the last 168 hours. No results for input(s): AMMONIA in the last 168 hours. Coagulation Profile: No results for input(s): INR, PROTIME in the last 168 hours. Cardiac Enzymes: No results for input(s): CKTOTAL, CKMB, CKMBINDEX, TROPONINI in the last 168 hours. BNP (last 3 results) No results for input(s): PROBNP in the last 8760  hours. HbA1C: No results for input(s): HGBA1C in the last 72 hours. CBG: No results for input(s): GLUCAP in the last 168 hours. Lipid Profile: No results for input(s): CHOL, HDL, LDLCALC, TRIG, CHOLHDL, LDLDIRECT in the last 72 hours. Thyroid Function Tests: No results for input(s): TSH, T4TOTAL, FREET4, T3FREE, THYROIDAB in the last 72 hours. Anemia Panel: No results for input(s): VITAMINB12, FOLATE, FERRITIN, TIBC, IRON, RETICCTPCT in the last 72 hours. Sepsis Labs: Recent Labs  Lab 03/08/19 2047 03/11/19 1440 03/11/19 1801  PROCALCITON  --   --  0.62  LATICACIDVEN 2.1* 1.9 1.1    Recent Results (from the past 240 hour(s))  Blood Culture (routine x 2)     Status: None (Preliminary result)   Collection Time: 03/11/19  6:01 PM   Specimen: BLOOD  Result Value Ref Range Status   Specimen Description BLOOD LEFT ANTECUBITAL  Final   Special Requests   Final    BOTTLES DRAWN AEROBIC AND ANAEROBIC Blood Culture adequate volume   Culture   Final    NO GROWTH < 24 HOURS Performed at [facility], [address], [place], [place] [address]    Report Status PENDING  Incomplete  Blood Culture (routine x 2)     Status: None (Preliminary result)   Collection Time: 03/11/19  6:18 PM   Specimen: BLOOD  Result Value Ref Range Status   Specimen Description BLOOD RIGHT ANTECUBITAL  Final   Special Requests   Final    BOTTLES DRAWN AEROBIC AND ANAEROBIC Blood Culture adequate volume   Culture   Final    NO GROWTH < 24 HOURS Performed at [facility], [place] [address], [place], [place] [address]    Report Status PENDING  Incomplete  C Difficile Quick Screen w PCR reflex     Status: None   Collection Time: 03/12/19  2:12 AM   Specimen: Stool  Result Value Ref Range Status   C Diff antigen NEGATIVE NEGATIVE Final   C Diff toxin NEGATIVE NEGATIVE Final   C Diff interpretation No C. difficile detected.  Final    Comment: Performed at [facility], [place] [address],  [place], [place] [address]     Radiology Studies: Ct Angio Chest Pe W/cm &/or Wo Cm  Result Date: 03/11/2019 CLINICAL DATA:  Inflammatory bowel disease. History of ulcerative colitis. Fever, tachycardia, diarrhea. Chest pain. EXAM: CT ANGIOGRAPHY CHEST CT ABDOMEN AND PELVIS WITH CONTRAST TECHNIQUE: Multidetector CT imaging of the chest was performed using the standard protocol during bolus administration of intravenous contrast. Multiplanar CT image reconstructions and MIPs were obtained to evaluate the vascular anatomy. Multidetector CT imaging of the abdomen and pelvis was performed using the standard protocol during bolus administration of intravenous contrast. CONTRAST:  158m OMNIPAQUE IOHEXOL 350 MG/ML SOLN COMPARISON:  CT scan 08/18/2017 FINDINGS: CTA CHEST FINDINGS Cardiovascular: The heart is normal in size. No pericardial effusion. The aorta is normal in caliber. No dissection. No atherosclerotic calcifications. Branch vessels are patent. No coronary artery calcifications. The pulmonary arterial tree is fairly well opacified. No filling defects to suggest pulmonary embolism. Mediastinum/Nodes: No mediastinal or hilar mass or adenopathy. The esophagus is grossly normal. Lungs/Pleura: The lungs are clear. No infiltrates, edema or effusions. No worrisome pulmonary lesions. Musculoskeletal: No breast masses, supraclavicular or axillary adenopathy. The bony thorax is intact. Review of the MIP images confirms the above findings. CT ABDOMEN and PELVIS FINDINGS Hepatobiliary: Simple appearing hepatic cyst is noted. No worrisome hepatic lesions. The gallbladder surgically absent. Mild associated intra and extrahepatic biliary dilatation. Pancreas: No mass, inflammation or ductal dilatation. Spleen: Normal size.  No focal lesions. Adrenals/Urinary Tract: The adrenal glands and kidneys are unremarkable. The bladder appears normal. Stomach/Bowel: The stomach, duodenum and small bowel are unremarkable. No acute  inflammatory changes or obstructive findings. Diffuse inflammation of the colon is noted consistent with known diagnosis of ulcerative colitis. Marked wall thickening with mucosal and serosal enhancement but no obstructive findings or mass lesions. The appendix is normal. Vascular/Lymphatic: The aorta is normal in caliber. No dissection. The branch vessels are patent. The major venous structures are patent. No mesenteric or retroperitoneal mass or adenopathy. Small scattered lymph nodes are noted. Reproductive: Stable markedly enlarged fibroid uterus. The ovaries appear normal. Other: No abdominal or pelvic abscess or free air. Musculoskeletal: No significant bony findings. Review of the MIP images confirms the  above findings. IMPRESSION: 1. No CT findings for pulmonary embolism. 2. Normal thoracic aorta. 3. No acute pulmonary findings. 4. Diffuse inflammatory process involving the colon most consistent with active ulcerative colitis. No mass or obstruction. 5. Stable enlarged fibroid uterus. Electronically Signed   By: Marijo Sanes M.D.   On: 03/11/2019 21:55   Ct Abdomen Pelvis W Contrast  Result Date: 03/11/2019 CLINICAL DATA:  Inflammatory bowel disease. History of ulcerative colitis. Fever, tachycardia, diarrhea. Chest pain. EXAM: CT ANGIOGRAPHY CHEST CT ABDOMEN AND PELVIS WITH CONTRAST TECHNIQUE: Multidetector CT imaging of the chest was performed using the standard protocol during bolus administration of intravenous contrast. Multiplanar CT image reconstructions and MIPs were obtained to evaluate the vascular anatomy. Multidetector CT imaging of the abdomen and pelvis was performed using the standard protocol during bolus administration of intravenous contrast. CONTRAST:  140m OMNIPAQUE IOHEXOL 350 MG/ML SOLN COMPARISON:  CT scan 08/18/2017 FINDINGS: CTA CHEST FINDINGS Cardiovascular: The heart is normal in size. No pericardial effusion. The aorta is normal in caliber. No dissection. No atherosclerotic  calcifications. Branch vessels are patent. No coronary artery calcifications. The pulmonary arterial tree is fairly well opacified. No filling defects to suggest pulmonary embolism. Mediastinum/Nodes: No mediastinal or hilar mass or adenopathy. The esophagus is grossly normal. Lungs/Pleura: The lungs are clear. No infiltrates, edema or effusions. No worrisome pulmonary lesions. Musculoskeletal: No breast masses, supraclavicular or axillary adenopathy. The bony thorax is intact. Review of the MIP images confirms the above findings. CT ABDOMEN and PELVIS FINDINGS Hepatobiliary: Simple appearing hepatic cyst is noted. No worrisome hepatic lesions. The gallbladder surgically absent. Mild associated intra and extrahepatic biliary dilatation. Pancreas: No mass, inflammation or ductal dilatation. Spleen: Normal size.  No focal lesions. Adrenals/Urinary Tract: The adrenal glands and kidneys are unremarkable. The bladder appears normal. Stomach/Bowel: The stomach, duodenum and small bowel are unremarkable. No acute inflammatory changes or obstructive findings. Diffuse inflammation of the colon is noted consistent with known diagnosis of ulcerative colitis. Marked wall thickening with mucosal and serosal enhancement but no obstructive findings or mass lesions. The appendix is normal. Vascular/Lymphatic: The aorta is normal in caliber. No dissection. The branch vessels are patent. The major venous structures are patent. No mesenteric or retroperitoneal mass or adenopathy. Small scattered lymph nodes are noted. Reproductive: Stable markedly enlarged fibroid uterus. The ovaries appear normal. Other: No abdominal or pelvic abscess or free air. Musculoskeletal: No significant bony findings. Review of the MIP images confirms the above findings. IMPRESSION: 1. No CT findings for pulmonary embolism. 2. Normal thoracic aorta. 3. No acute pulmonary findings. 4. Diffuse inflammatory process involving the colon most consistent with  active ulcerative colitis. No mass or obstruction. 5. Stable enlarged fibroid uterus. Electronically Signed   By: PMarijo SanesM.D.   On: 03/11/2019 21:55   Dg Chest Port 1 View  Result Date: 03/11/2019 CLINICAL DATA:  COVID-19 positive 02/23/2019 EXAM: PORTABLE CHEST 1 VIEW COMPARISON:  None. FINDINGS: No consolidation, features of edema, pneumothorax, or effusion. Pulmonary vascularity is normally distributed. The cardiomediastinal contours are unremarkable. No acute osseous or soft tissue abnormality. IMPRESSION: No acute cardiopulmonary abnormality. Electronically Signed   By: PLovena LeM.D.   On: 03/11/2019 18:08    Scheduled Meds:  enoxaparin (LOVENOX) injection  40 mg Subcutaneous Q24H   methylPREDNISolone (SOLU-MEDROL) injection  40 mg Intravenous Q12H   potassium chloride  40 mEq Oral BID   Continuous Infusions:  cefTRIAXone (ROCEPHIN)  IV     metronidazole 500 mg (03/12/19 1252)  LOS: 1 day   Marylu Lund, MD [facility] Pager On Amion  If 7PM-7AM, please contact night-coverage 03/12/2019, 2:29 PM

## 2019-03-12 NOTE — ED Notes (Signed)
Lunch Tray Ordered @ 1020.  

## 2019-03-12 NOTE — ED Notes (Signed)
Full liquid dinner tray ordered

## 2019-03-12 NOTE — ED Notes (Signed)
Pt eating lunch tray

## 2019-03-12 NOTE — ED Notes (Signed)
Critical Care paged to [phone]-per Kathlee Nations, RN paged by Levada Dy

## 2019-03-12 NOTE — Consult Note (Signed)
Referring Provider:  [place] Primary Care Physician:  Harlan Stains, MD Primary Gastroenterologist:  Dr. Alessandra Bevels  Reason for Consultation:   Ulcerative colitis flare/recently diagnosed with Covid  HPI: Annette Thomas is a 48 y.o. female with past medical history of nonspecific, probably ulcerative colitis, was diagnosed with COVID-19 February 23, 2019 presented to the hospital with worsening abdominal pain.  Was last seen in the office in August 2020.  Please see previous GI history for detail.  Patient's colitis was well controlled on Lialda.  She has noted worsening symptoms after being diagnosed with COVID-19.  She was advised to increase Lialda.  Patient did call our office couple days ago for fever and abdominal pain and she was advised to submit stool sample to rule out C. difficile and outpatient CT abdomen pelvis was ordered.  But because of ongoing fever and abdominal pain she decided to come to the emergency room.  She is complaining of diarrhea with 5-7 bowel movements per day.  Denies any blood in the stool.  Complaining of nausea and had one episode of vomiting.  Continues to have a fever.   Please see previous GI history for detail. Patient has previously declined to take long-term medication such as mesalamine.  She was started on Lialda in May 2020 because of intermittent GI symptoms.               Previous GI history        Patient was initially seen for evaluation of diarrhea and erythema nodosum and rectal bleeding. She underwent colonoscopy on 12/05/2016 which showed nonspecific inflammation in the sigmoid colon ,descending colon, transverse colon and cecum. Normal terminal ileum. Biopsy showed active cryptitis with mild crypt distortion. Although according to pathologist findings were consistent with diverticular disease associated colitis, patient did not had diverticulosis in the transverse colon as well as in the cecum. 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 [facility] IBD panel was negative in December 2018.         Because of ongoing intermittent symptoms CT abdomen pelvis with contrast was ordered in April 2019 which showed diffuse mild colitis. flexible sigmoidoscopy in October 2019 showed mildly erythematous mucosa in the rectum. Biopsy again showed focal active proctitis. She was prescribed Canasa which she used for 30 days. and was feeling better during that time.          Family history of large colon polyp in father at age 43.  History reviewed. No pertinent past medical history.  Past Surgical History:  Procedure Laterality Date  . GALLBLADDER SURGERY    . IR RADIOLOGIST EVAL & MGMT  03/28/2017    Prior to Admission medications   Medication Sig Start Date End Date Taking? Authorizing Provider  amLODipine (NORVASC) 5 MG tablet Take 5 mg by mouth daily.   Yes [provider]  Cetirizine HCl 10 MG CAPS Take 1 capsule (10 mg total) by mouth daily for 10 days. 02/23/19 03/11/19 Yes Wieters, Hallie C, PA-C  dicyclomine (BENTYL) 20 MG tablet Take 20 mg by mouth every 6 (six) hours as needed.   Yes [provider]  fluticasone (FLONASE) 50 MCG/ACT nasal spray Place 1-2 sprays into both nostrils daily for 7 days. 02/23/19 03/11/19 Yes Wieters, Hallie C, PA-C  HYDROcodone-acetaminophen (NORCO/VICODIN) 5-325 MG tablet Take 1 tablet by mouth every 6 (six) hours as needed for severe pain. 04/25/18  Yes [provider]  mesalamine (LIALDA) 1.2 g EC tablet Take 2.4 g by mouth daily.  01/29/19  Yes [provider]  Multiple Vitamin (MULTIVITAMIN) tablet Take 1 tablet by mouth daily.   Yes [provider]  naproxen (NAPROSYN) 500 MG tablet Take 500 mg by mouth as needed.   Yes [provider]  VITAMIN D, ERGOCALCIFEROL, PO Take 5,000 Units by mouth daily.   Yes [provider]  hydrochlorothiazide (MICROZIDE) 12.5 MG  capsule Take 1 capsule (12.5 mg total) by mouth daily. Patient not taking: Reported on 10/04/2015 08/04/11 02/23/19  Darlyne Russian, MD  magic mouthwash SOLN 1: 1 benadryl to maalox 10/04/15 02/23/19  Deno Etienne, DO    Scheduled Meds: . enoxaparin (LOVENOX) injection  40 mg Subcutaneous Q24H  . potassium chloride  40 mEq Oral BID   Continuous Infusions: . cefTRIAXone (ROCEPHIN)  IV    . metronidazole Stopped (03/12/19 0455)   PRN Meds:.acetaminophen **OR** acetaminophen, morphine injection, ondansetron (ZOFRAN) IV  Allergies as of 03/11/2019  . (No Known Allergies)    Family History  Problem Relation Age of Onset  . Heart attack Paternal Grandfather   . Brain cancer Paternal Grandmother        tumor  . Diabetes Maternal Grandmother   . Heart attack Maternal Grandfather   . Diabetes Maternal Grandfather   . Heart attack Father   . Diabetes Mother     Social History   Socioeconomic History  . Marital status: Married    Spouse name: Not on file  . Number of children: Not on file  . Years of education: Not on file  . Highest education level: Not on file  Occupational History  . Not on file  Social Needs  . Financial resource strain: Not on file  . Food insecurity    Worry: Not on file    Inability: Not on file  . Transportation needs    Medical: Not on file    Non-medical: Not on file  Tobacco Use  . Smoking status: Never Smoker  . Smokeless tobacco: Never Used  Substance and Sexual Activity  . Alcohol use: Yes    Comment: social  . Drug use: No  . Sexual activity: Yes    Partners: Male    Birth control/protection: I.U.D.    Comment: paragard  Lifestyle  . Physical activity    Days per week: Not on file    Minutes per session: Not on file  . Stress: Not on file  Relationships  . Social Herbalist on phone: Not on file    Gets together: Not on file    Attends religious service: Not on file    Active member of club or organization: Not on file     Attends meetings of clubs or organizations: Not on file    Relationship status: Not on file  . Intimate partner violence    Fear of current or ex partner: Not on file    Emotionally abused: Not on file    Physically abused: Not on file    Forced sexual activity: Not on file  Other Topics Concern  . Not on file  Social History Narrative  . Not on file    Review of Systems: Limited review of system negative as per HPI.  Physical Exam: Vital signs: Vitals:   03/12/19 0914 03/12/19 0941  BP:  127/79  Pulse: 98 94  Resp: 15 16  Temp: 99 F (37.2 C)   SpO2: 100% 99%  General:    Well-developed, well-nourished, pleasant and cooperative in NAD Lungs: Anterior exam only.  No respiratory distress.  Clear breath sounds. Heart:  Regular rate and rhythm; no murmurs, clicks, rubs,  or gallops. Abdomen: Soft, bilateral lower quadrant discomfort on palpation, nondistended, bowel sounds present LE; no edema Neuro.  Alert/oriented x3. Psych.  Mood and affect normal Rectal:  Deferred  GI:  Lab Results: Recent Labs    03/11/19 1422 03/12/19 0212  WBC 13.3* 10.5  HGB 12.9 10.5*  HCT 38.9 30.9*  PLT 330 263   BMET Recent Labs    03/11/19 1422 03/12/19 0212  NA 138 138  K 3.1* 2.9*  CL 102 111  CO2 22 17*  GLUCOSE 105* 121*  BUN 6 6  CREATININE 1.03* 0.92  CALCIUM 9.1 7.9*   LFT Recent Labs    03/11/19 1422  PROT 7.1  ALBUMIN 3.5  AST 18  ALT 23  ALKPHOS 79  BILITOT 1.1   PT/INR No results for input(s): LABPROT, INR in the last 72 hours.   Studies/Results: Ct Angio Chest Pe W/cm &/or Wo Cm  Result Date: 03/11/2019 CLINICAL DATA:  Inflammatory bowel disease. History of ulcerative colitis. Fever, tachycardia, diarrhea. Chest pain. EXAM: CT ANGIOGRAPHY CHEST CT ABDOMEN AND PELVIS WITH CONTRAST TECHNIQUE: Multidetector CT imaging of the chest was performed using the standard protocol during bolus administration of intravenous contrast. Multiplanar CT image  reconstructions and MIPs were obtained to evaluate the vascular anatomy. Multidetector CT imaging of the abdomen and pelvis was performed using the standard protocol during bolus administration of intravenous contrast. CONTRAST:  161m OMNIPAQUE IOHEXOL 350 MG/ML SOLN COMPARISON:  CT scan 08/18/2017 FINDINGS: CTA CHEST FINDINGS Cardiovascular: The heart is normal in size. No pericardial effusion. The aorta is normal in caliber. No dissection. No atherosclerotic calcifications. Branch vessels are patent. No coronary artery calcifications. The pulmonary arterial tree is fairly well opacified. No filling defects to suggest pulmonary embolism. Mediastinum/Nodes: No mediastinal or hilar mass or adenopathy. The esophagus is grossly normal. Lungs/Pleura: The lungs are clear. No infiltrates, edema or effusions. No worrisome pulmonary lesions. Musculoskeletal: No breast masses, supraclavicular or axillary adenopathy. The bony thorax is intact. Review of the MIP images confirms the above findings. CT ABDOMEN and PELVIS FINDINGS Hepatobiliary: Simple appearing hepatic cyst is noted. No worrisome hepatic lesions. The gallbladder surgically absent. Mild associated intra and extrahepatic biliary dilatation. Pancreas: No mass, inflammation or ductal dilatation. Spleen: Normal size.  No focal lesions. Adrenals/Urinary Tract: The adrenal glands and kidneys are unremarkable. The bladder appears normal. Stomach/Bowel: The stomach, duodenum and small bowel are unremarkable. No acute inflammatory changes or obstructive findings. Diffuse inflammation of the colon is noted consistent with known diagnosis of ulcerative colitis. Marked wall thickening with mucosal and serosal enhancement but no obstructive findings or mass lesions. The appendix is normal. Vascular/Lymphatic: The aorta is normal in caliber. No dissection. The branch vessels are patent. The major venous structures are patent. No mesenteric or retroperitoneal mass or  adenopathy. Small scattered lymph nodes are noted. Reproductive: Stable markedly enlarged fibroid uterus. The ovaries appear normal. Other: No abdominal or pelvic abscess or free air. Musculoskeletal: No significant bony findings. Review of the MIP images confirms the above findings. IMPRESSION: 1. No CT findings for pulmonary embolism. 2. Normal thoracic aorta. 3. No acute pulmonary findings. 4. Diffuse inflammatory process involving the colon most consistent with active ulcerative colitis. No mass or obstruction. 5. Stable enlarged fibroid uterus. Electronically Signed   By: PMarijo Sanes  M.D.   On: 03/11/2019 21:55   Ct Abdomen Pelvis W Contrast  Result Date: 03/11/2019 CLINICAL DATA:  Inflammatory bowel disease. History of ulcerative colitis. Fever, tachycardia, diarrhea. Chest pain. EXAM: CT ANGIOGRAPHY CHEST CT ABDOMEN AND PELVIS WITH CONTRAST TECHNIQUE: Multidetector CT imaging of the chest was performed using the standard protocol during bolus administration of intravenous contrast. Multiplanar CT image reconstructions and MIPs were obtained to evaluate the vascular anatomy. Multidetector CT imaging of the abdomen and pelvis was performed using the standard protocol during bolus administration of intravenous contrast. CONTRAST:  113m OMNIPAQUE IOHEXOL 350 MG/ML SOLN COMPARISON:  CT scan 08/18/2017 FINDINGS: CTA CHEST FINDINGS Cardiovascular: The heart is normal in size. No pericardial effusion. The aorta is normal in caliber. No dissection. No atherosclerotic calcifications. Branch vessels are patent. No coronary artery calcifications. The pulmonary arterial tree is fairly well opacified. No filling defects to suggest pulmonary embolism. Mediastinum/Nodes: No mediastinal or hilar mass or adenopathy. The esophagus is grossly normal. Lungs/Pleura: The lungs are clear. No infiltrates, edema or effusions. No worrisome pulmonary lesions. Musculoskeletal: No breast masses, supraclavicular or axillary  adenopathy. The bony thorax is intact. Review of the MIP images confirms the above findings. CT ABDOMEN and PELVIS FINDINGS Hepatobiliary: Simple appearing hepatic cyst is noted. No worrisome hepatic lesions. The gallbladder surgically absent. Mild associated intra and extrahepatic biliary dilatation. Pancreas: No mass, inflammation or ductal dilatation. Spleen: Normal size.  No focal lesions. Adrenals/Urinary Tract: The adrenal glands and kidneys are unremarkable. The bladder appears normal. Stomach/Bowel: The stomach, duodenum and small bowel are unremarkable. No acute inflammatory changes or obstructive findings. Diffuse inflammation of the colon is noted consistent with known diagnosis of ulcerative colitis. Marked wall thickening with mucosal and serosal enhancement but no obstructive findings or mass lesions. The appendix is normal. Vascular/Lymphatic: The aorta is normal in caliber. No dissection. The branch vessels are patent. The major venous structures are patent. No mesenteric or retroperitoneal mass or adenopathy. Small scattered lymph nodes are noted. Reproductive: Stable markedly enlarged fibroid uterus. The ovaries appear normal. Other: No abdominal or pelvic abscess or free air. Musculoskeletal: No significant bony findings. Review of the MIP images confirms the above findings. IMPRESSION: 1. No CT findings for pulmonary embolism. 2. Normal thoracic aorta. 3. No acute pulmonary findings. 4. Diffuse inflammatory process involving the colon most consistent with active ulcerative colitis. No mass or obstruction. 5. Stable enlarged fibroid uterus. Electronically Signed   By: PMarijo SanesM.D.   On: 03/11/2019 21:55   Dg Chest Port 1 View  Result Date: 03/11/2019 CLINICAL DATA:  COVID-19 positive 02/23/2019 EXAM: PORTABLE CHEST 1 VIEW COMPARISON:  None. FINDINGS: No consolidation, features of edema, pneumothorax, or effusion. Pulmonary vascularity is normally distributed. The cardiomediastinal  contours are unremarkable. No acute osseous or soft tissue abnormality. IMPRESSION: No acute cardiopulmonary abnormality. Electronically Signed   By: PLovena LeM.D.   On: 03/11/2019 18:08    Impression/Plan: -Nonbloody diarrhea with known nonspecific/possible ulcerative colitis.  Probably ulcerative colitis flare or less likely but possible Covid related gastroenteritis. -Abnormal CT scan concerning for moderate thickening of the colon.  From underlying colitis.   Recommendations ------------------------- - C. difficile is negative. -Start Solu-Medrol IV 40 mg twice a day. -Advance diet as tolerated. -Continue Lialda and antibiotics for now -Follow GI pathogen panel -GI will follow    LOS: 1 day   POtis Brace MD, FACP 03/12/2019, 10:28 AM  Contact #  [phone]

## 2019-03-13 ENCOUNTER — Encounter (HOSPITAL_COMMUNITY): Payer: Self-pay

## 2019-03-13 DIAGNOSIS — K5100 Ulcerative (chronic) pancolitis without complications: Secondary | ICD-10-CM

## 2019-03-13 LAB — COMPREHENSIVE METABOLIC PANEL
ALT: 20 U/L (ref 0–44)
AST: 11 U/L — ABNORMAL LOW (ref 15–41)
Albumin: 2.6 g/dL — ABNORMAL LOW (ref 3.5–5.0)
Alkaline Phosphatase: 77 U/L (ref 38–126)
Anion gap: 9 (ref 5–15)
BUN: 5 mg/dL — ABNORMAL LOW (ref 6–20)
CO2: 22 mmol/L (ref 22–32)
Calcium: 8.8 mg/dL — ABNORMAL LOW (ref 8.9–10.3)
Chloride: 109 mmol/L (ref 98–111)
Creatinine, Ser: 0.72 mg/dL (ref 0.44–1.00)
GFR calc Af Amer: >60 mL/min (ref >60)
GFR calc non Af Amer: >60 mL/min (ref >60)
Glucose, Bld: 163 mg/dL — ABNORMAL HIGH (ref 70–99)
Potassium: 3.7 mmol/L (ref 3.5–5.1)
Sodium: 140 mmol/L (ref 135–145)
Total Bilirubin: 0.2 mg/dL — ABNORMAL LOW (ref 0.3–1.2)
Total Protein: 6.2 g/dL — ABNORMAL LOW (ref 6.5–8.1)

## 2019-03-13 LAB — CBC WITH DIFFERENTIAL/PLATELET
Abs Immature Granulocytes: 0.25 10*3/uL — ABNORMAL HIGH (ref 0.00–0.07)
Basophils Absolute: 0.1 10*3/uL (ref 0.0–0.1)
Basophils Relative: 0 %
Eosinophils Absolute: 0 10*3/uL (ref 0.0–0.5)
Eosinophils Relative: 0 %
HCT: 32.1 % — ABNORMAL LOW (ref 36.0–46.0)
Hemoglobin: 11.2 g/dL — ABNORMAL LOW (ref 12.0–15.0)
Immature Granulocytes: 1 %
Lymphocytes Relative: 7 %
Lymphs Abs: 1.2 10*3/uL (ref 0.7–4.0)
MCH: 28.1 pg (ref 26.0–34.0)
MCHC: 34.9 g/dL (ref 30.0–36.0)
MCV: 80.7 fL (ref 80.0–100.0)
Monocytes Absolute: 0.8 10*3/uL (ref 0.1–1.0)
Monocytes Relative: 5 %
Neutro Abs: 15.2 10*3/uL — ABNORMAL HIGH (ref 1.7–7.7)
Neutrophils Relative %: 87 %
Platelets: 333 10*3/uL (ref 150–400)
RBC: 3.98 MIL/uL (ref 3.87–5.11)
RDW: 13.9 % (ref 11.5–15.5)
WBC Morphology: INCREASED
WBC: 17.6 10*3/uL — ABNORMAL HIGH (ref 4.0–10.5)
nRBC: 0 % (ref 0.0–0.2)

## 2019-03-13 LAB — D-DIMER, QUANTITATIVE: D-Dimer, Quant: 2.01 ug/mL-FEU — ABNORMAL HIGH (ref 0.00–0.50)

## 2019-03-13 LAB — FERRITIN: Ferritin: 135 ng/mL (ref 11–307)

## 2019-03-13 LAB — ABO/RH: ABO/RH(D): O POS

## 2019-03-13 LAB — C-REACTIVE PROTEIN: CRP: 23.8 mg/dL — ABNORMAL HIGH (ref <1.0)

## 2019-03-13 NOTE — Progress Notes (Addendum)
[facility] Gastroenterology Progress Note  Annette Thomas 48 y.o. [birth year]  CC: Diarrhea, ulcerative colitis, Covid positive   Subjective: Feeling much better today.  Abdominal pain has improved.  Only had 3 bowel movements today.  Denies blood in the stool  ROS : Negative for chest pain or shortness of breath   Objective: Vital signs in last 24 hours: Vitals:   03/13/19 0116 03/13/19 0802  BP: (!) 141/83 121/74  Pulse: 89 78  Resp: 20   Temp: 98 F (36.7 C) 97.9 F (36.6 C)  SpO2: 100% 100%    Physical Exam:  General.  Well-developed,.  Not in acute distress Respiratory.  No visible distress noted Abdomen.  Soft, nondistended, no peritoneal signs Lower extremity.  No edema Neuro.  Alert/oriented x3 Psych.  Mood and affect normal  Lab Results: Recent Labs    03/11/19 2326 03/12/19 0212 03/13/19 0422  NA  --  138 140  K  --  2.9* 3.7  CL  --  111 109  CO2  --  17* 22  GLUCOSE  --  121* 163*  BUN  --  6 5*  CREATININE  --  0.92 0.72  CALCIUM  --  7.9* 8.8*  MG 2.0  --   --    Recent Labs    03/11/19 1422 03/13/19 0422  AST 18 11*  ALT 23 20  ALKPHOS 79 77  BILITOT 1.1 0.2*  PROT 7.1 6.2*  ALBUMIN 3.5 2.6*   Recent Labs    03/11/19 1422 03/12/19 0212 03/13/19 0422  WBC 13.3* 10.5 17.6*  NEUTROABS 10.8*  --  15.2*  HGB 12.9 10.5* 11.2*  HCT 38.9 30.9* 32.1*  MCV 82.4 82.6 80.7  PLT 330 263 333   No results for input(s): LABPROT, INR in the last 72 hours.    Assessment/Plan: -Nonbloody diarrhea with known nonspecific/possible ulcerative colitis.  Probably ulcerative colitis flare or less likely but possible Covid related gastroenteritis. -Abnormal CT scan concerning for moderate thickening of the colon.  From underlying colitis.   Recommendations ------------------------- - C. difficile and GI pathogen panel negative. -Advance diet to soft -Continue IV Solu-Medrol today.   -Okay to DC antibiotics from GI standpoint - Switch to p.o.  prednisone 40 mg once a day tomorrow if able to tolerate diet. -Recommend to taper prednisone (40 mg once a day for 1 week followed by 30 mg once a day for another week followed by 20 mg once a day for another week and then 10 mg once a day for 1 week) -Okay to discharge tomorrow from GI standpoint if continues to do well  -GI will sign off.  Follow-up in GI clinic in 2 to 4 weeks after discharge.   Otis Brace MD, [place] 03/13/2019, 11:32 AM  Contact #  [phone]

## 2019-03-13 NOTE — Progress Notes (Signed)
PROGRESS NOTE    Annette Thomas  YWV:[id number] DOB: [birth year] DOA: 03/11/2019 PCP: Harlan Stains, MD    Brief Narrative:  48 y.o. female with medical history significant of ulcerative colitis, hypertension, fibroids recently tested positive for COVID-19 on 10/18 presenting with complaints of abdominal pain.  Patient reports 6-day history of bilateral lower quadrant abdominal pain.  She has been having multiple episodes of clay colored, nonbloody diarrhea.  She vomited once but continues to feel nauseous.  She has been having fevers for the past few days.  States she is seen by Dr. Trula Slade from [facility] for her ulcerative colitis.  States she tested positive for COVID-19 on 10/18 and her husband is currently admitted to [place].  She is currently not having any respiratory complaints.  Denies shortness of breath or cough.  ED Course: Temperature 103.1 F, tachycardic.  White blood cell count 13.3.  Procalcitonin 0.62.  CRP 27.8.  Lactic acid x2 normal.  Potassium 3.1.  C. difficile PCR and GI pathogen panel pending.  UA not suggestive of infection.  Urine culture pending.  Blood culture x2 pending.  Chest x-ray showing no acute cardiopulmonary abnormality.  CT angiogram negative for PE and no acute pulmonary findings.  CT abdomen showing diffuse inflammatory process involving the colon most consistent with active ulcerative colitis.  No mass or obstruction. Patient received Tylenol, morphine, Zofran, potassium supplementation, ceftriaxone, Flagyl, and 2 L normal saline boluses.   Assessment & Plan:   Principal Problem:   Exacerbation of ulcerative colitis ([place]) Active Problems:   Hypokalemia   Lab test positive for detection of COVID-19 virus   Sepsis (HCC)   HTN (hypertension)    Sepsis secondary to acute exacerbation of ulcerative colitis -Clinically much improved -Appreciate GI assistance. Recommendation for prednisone 45m once a day with taper over one week -Advance  diet, and per GI, OK to d/c tomorrow if pt does well. GI has since signed off  Mild hypokalemia -Likely secondary to GI losses from vomiting/diarrhea.   -Replaced -Stable  Recent COVID-19 viral infection -Patient reports testing positive for COVID-19 on 10/18.   -Currently no respiratory symptoms and no signs of respiratory distress.   -CT angiogram without acute pulmonary findings, reviewed -Repeat SARS-CoV-2 test is pos  Hypertension -Currently normotensive.   -Held antihypertensives in the setting of presenting sepsis. -Stable at this time  HIV screening -HIV is neg, reviewed  DVT prophylaxis: Lovenox subQ Code Status: Full Family Communication: Pt in room, family not at bedside Disposition Plan: Likely discharge home in 24hrs  Consultants:   GI  Procedures:     Antimicrobials: Anti-infectives (From admission, onward)   Start     Dose/Rate Route Frequency Ordered Stop   03/12/19 1900  cefTRIAXone (ROCEPHIN) 2 g in sodium chloride 0.9 % 100 mL IVPB     2 g 200 mL/hr over 30 Minutes Intravenous Every 24 hours 03/11/19 2327     03/12/19 0300  metroNIDAZOLE (FLAGYL) IVPB 500 mg     500 mg 100 mL/hr over 60 Minutes Intravenous Every 8 hours 03/11/19 2327     03/11/19 1930  cefTRIAXone (ROCEPHIN) 2 g in sodium chloride 0.9 % 100 mL IVPB     2 g 200 mL/hr over 30 Minutes Intravenous  Once 03/11/19 1926 03/11/19 2102   03/11/19 1930  metroNIDAZOLE (FLAGYL) IVPB 500 mg     500 mg 100 mL/hr over 60 Minutes Intravenous  Once 03/11/19 1926 03/11/19 2147      Subjective: States feeling  much better  Objective: Vitals:   03/13/19 0116 03/13/19 0116 03/13/19 0802 03/13/19 1500  BP:  (!) 141/83 121/74 122/78  Pulse:  89 78 80  Resp:  20  19  Temp:  98 F (36.7 C) 97.9 F (36.6 C) 98 F (36.7 C)  TempSrc: Oral Oral  Oral  SpO2:  100% 100% 100%  Weight: 69.8 kg     Height: 5' 7"  (1.702 m)       Intake/Output Summary (Last 24 hours) at 03/13/2019 1906 Last  data filed at 03/12/2019 2228 Gross per 24 hour  Intake 150 ml  Output --  Net 150 ml   Filed Weights   03/11/19 1359 03/13/19 0116  Weight: 68.9 kg 69.8 kg    Examination: General exam: Awake, laying in bed, in nad Respiratory system: Normal respiratory effort, no wheezing Cardiovascular system: regular rate, s1, s2 Gastrointestinal system: Soft, nondistended, positive BS Central nervous system: CN2-12 grossly intact, strength intact Extremities: Perfused, no clubbing Skin: Normal skin turgor, no notable skin lesions seen Psychiatry: Mood normal // no visual hallucinations   Data Reviewed: I have personally reviewed following labs and imaging studies  CBC: Recent Labs  Lab 03/08/19 2047 03/11/19 1422 03/12/19 0212 03/13/19 0422  WBC 9.1 13.3* 10.5 17.6*  NEUTROABS 6.8 10.8*  --  15.2*  HGB 13.2 12.9 10.5* 11.2*  HCT 39.7 38.9 30.9* 32.1*  MCV 83.6 82.4 82.6 80.7  PLT 314 330 263 160   Basic Metabolic Panel: Recent Labs  Lab 03/08/19 2047 03/11/19 1422 03/11/19 2326 03/12/19 0212 03/13/19 0422  NA 137 138  --  138 140  K 3.0* 3.1*  --  2.9* 3.7  CL 103 102  --  111 109  CO2 20* 22  --  17* 22  GLUCOSE 151* 105*  --  121* 163*  BUN 8 6  --  6 5*  CREATININE 1.00 1.03*  --  0.92 0.72  CALCIUM 9.5 9.1  --  7.9* 8.8*  MG  --   --  2.0  --   --    GFR: Estimated Creatinine Clearance: 81.8 mL/min (by C-G formula based on SCr of 0.72 mg/dL). Liver Function Tests: Recent Labs  Lab 03/08/19 2047 03/11/19 1422 03/13/19 0422  AST 26 18 11*  ALT 29 23 20   ALKPHOS 78 79 77  BILITOT 0.8 1.1 0.2*  PROT 7.3 7.1 6.2*  ALBUMIN 3.8 3.5 2.6*   No results for input(s): LIPASE, AMYLASE in the last 168 hours. No results for input(s): AMMONIA in the last 168 hours. Coagulation Profile: No results for input(s): INR, PROTIME in the last 168 hours. Cardiac Enzymes: No results for input(s): CKTOTAL, CKMB, CKMBINDEX, TROPONINI in the last 168 hours. BNP (last 3  results) No results for input(s): PROBNP in the last 8760 hours. HbA1C: No results for input(s): HGBA1C in the last 72 hours. CBG: No results for input(s): GLUCAP in the last 168 hours. Lipid Profile: No results for input(s): CHOL, HDL, LDLCALC, TRIG, CHOLHDL, LDLDIRECT in the last 72 hours. Thyroid Function Tests: No results for input(s): TSH, T4TOTAL, FREET4, T3FREE, THYROIDAB in the last 72 hours. Anemia Panel: Recent Labs    03/13/19 0422  FERRITIN 135   Sepsis Labs: Recent Labs  Lab 03/08/19 2047 03/11/19 1440 03/11/19 1801  PROCALCITON  --   --  0.62  LATICACIDVEN 2.1* 1.9 1.1    Recent Results (from the past 240 hour(s))  Blood Culture (routine x 2)     Status: None (  Preliminary result)   Collection Time: 03/11/19  6:01 PM   Specimen: BLOOD  Result Value Ref Range Status   Specimen Description BLOOD LEFT ANTECUBITAL  Final   Special Requests   Final    BOTTLES DRAWN AEROBIC AND ANAEROBIC Blood Culture adequate volume   Culture   Final    NO GROWTH 2 DAYS Performed at [facility], [address], [place], [place] [address]    Report Status PENDING  Incomplete  Blood Culture (routine x 2)     Status: None (Preliminary result)   Collection Time: 03/11/19  6:18 PM   Specimen: BLOOD  Result Value Ref Range Status   Specimen Description BLOOD RIGHT ANTECUBITAL  Final   Special Requests   Final    BOTTLES DRAWN AEROBIC AND ANAEROBIC Blood Culture adequate volume   Culture   Final    NO GROWTH 2 DAYS Performed at [facility], [place] [address], [place], [place] [address]    Report Status PENDING  Incomplete  Urine culture     Status: None   Collection Time: 03/11/19  9:26 PM   Specimen: Urine, Clean Catch  Result Value Ref Range Status   Specimen Description URINE, CLEAN CATCH  Final   Special Requests NONE  Final   Culture   Final    NO GROWTH Performed at [facility], [place] [address] [place], [place] [address]    Report Status 03/12/2019  FINAL  Final  Gastrointestinal Panel by PCR , Stool     Status: None   Collection Time: 03/12/19  2:12 AM   Specimen: Stool  Result Value Ref Range Status   Campylobacter species NOT DETECTED NOT DETECTED Final   Plesimonas shigelloides NOT DETECTED NOT DETECTED Final   Salmonella species NOT DETECTED NOT DETECTED Final   Yersinia enterocolitica NOT DETECTED NOT DETECTED Final   Vibrio species NOT DETECTED NOT DETECTED Final   Vibrio cholerae NOT DETECTED NOT DETECTED Final   Enteroaggregative E coli (EAEC) NOT DETECTED NOT DETECTED Final   Enteropathogenic E coli (EPEC) NOT DETECTED NOT DETECTED Final   Enterotoxigenic E coli (ETEC) NOT DETECTED NOT DETECTED Final   Shiga like toxin producing E coli (STEC) NOT DETECTED NOT DETECTED Final   Shigella/Enteroinvasive E coli (EIEC) NOT DETECTED NOT DETECTED Final   Cryptosporidium NOT DETECTED NOT DETECTED Final   Cyclospora cayetanensis NOT DETECTED NOT DETECTED Final   Entamoeba histolytica NOT DETECTED NOT DETECTED Final   Giardia lamblia NOT DETECTED NOT DETECTED Final   Adenovirus F40/41 NOT DETECTED NOT DETECTED Final   Astrovirus NOT DETECTED NOT DETECTED Final   Norovirus GI/GII NOT DETECTED NOT DETECTED Final   Rotavirus A NOT DETECTED NOT DETECTED Final   Sapovirus (I, II, IV, and V) NOT DETECTED NOT DETECTED Final    Comment: Performed at [facility], [address] [place], [place] [address]  C Difficile Quick Screen w PCR reflex     Status: None   Collection Time: 03/12/19  2:12 AM   Specimen: Stool  Result Value Ref Range Status   C Diff antigen NEGATIVE NEGATIVE Final   C Diff toxin NEGATIVE NEGATIVE Final   C Diff interpretation No C. difficile detected.  Final    Comment: Performed at [facility], [place] [address], [place], [place] [address]  SARS CORONAVIRUS 2 (TAT 6-24 HRS) Nasopharyngeal Nasopharyngeal Swab     Status: Abnormal   Collection Time: 03/12/19  8:01 AM   Specimen:  Nasopharyngeal Swab   Result Value Ref Range Status   SARS Coronavirus 2 POSITIVE (A) NEGATIVE Final    Comment: RESULT CALLED TO, READ BACK BY AND VERIFIED WITH: Precious Reel RN 14:30 03/12/19 ([email or web address]) (NOTE) SARS-CoV-2 target nucleic acids are DETECTED. The SARS-CoV-2 RNA is generally detectable in upper and lower respiratory specimens during the acute phase of infection. Positive results are indicative of active infection with SARS-CoV-2. Clinical  correlation with patient history and other diagnostic information is necessary to determine patient infection status. Positive results do  not rule out bacterial infection or co-infection with other viruses. The expected result is Negative. Fact Sheet for Patients: SugarRoll.be Fact Sheet for Healthcare Providers: https://www.woods-mathews.com/ This test is not yet approved or cleared by the [place] FDA and  has been authorized for detection and/or diagnosis of SARS-CoV-2 by FDA under an Emergency Use Authorization (EUA). This EUA will remain  in effect (meaning this test can be used) for  the duration of the COVID-19 declaration under Section 564(b)(1) of the Act, 21 U.S.C. section 360bbb-3(b)(1), unless the authorization is terminated or revoked sooner. Performed at [facility], [place] [address], [place], [place] [address]      Radiology Studies: Ct Angio Chest Pe W/cm &/or Wo Cm  Result Date: 03/11/2019 CLINICAL DATA:  Inflammatory bowel disease. History of ulcerative colitis. Fever, tachycardia, diarrhea. Chest pain. EXAM: CT ANGIOGRAPHY CHEST CT ABDOMEN AND PELVIS WITH CONTRAST TECHNIQUE: Multidetector CT imaging of the chest was performed using the standard protocol during bolus administration of intravenous contrast. Multiplanar CT image reconstructions and MIPs were obtained to evaluate the vascular anatomy. Multidetector CT imaging of the abdomen and pelvis was performed using the standard protocol  during bolus administration of intravenous contrast. CONTRAST:  175m OMNIPAQUE IOHEXOL 350 MG/ML SOLN COMPARISON:  CT scan 08/18/2017 FINDINGS: CTA CHEST FINDINGS Cardiovascular: The heart is normal in size. No pericardial effusion. The aorta is normal in caliber. No dissection. No atherosclerotic calcifications. Branch vessels are patent. No coronary artery calcifications. The pulmonary arterial tree is fairly well opacified. No filling defects to suggest pulmonary embolism. Mediastinum/Nodes: No mediastinal or hilar mass or adenopathy. The esophagus is grossly normal. Lungs/Pleura: The lungs are clear. No infiltrates, edema or effusions. No worrisome pulmonary lesions. Musculoskeletal: No breast masses, supraclavicular or axillary adenopathy. The bony thorax is intact. Review of the MIP images confirms the above findings. CT ABDOMEN and PELVIS FINDINGS Hepatobiliary: Simple appearing hepatic cyst is noted. No worrisome hepatic lesions. The gallbladder surgically absent. Mild associated intra and extrahepatic biliary dilatation. Pancreas: No mass, inflammation or ductal dilatation. Spleen: Normal size.  No focal lesions. Adrenals/Urinary Tract: The adrenal glands and kidneys are unremarkable. The bladder appears normal. Stomach/Bowel: The stomach, duodenum and small bowel are unremarkable. No acute inflammatory changes or obstructive findings. Diffuse inflammation of the colon is noted consistent with known diagnosis of ulcerative colitis. Marked wall thickening with mucosal and serosal enhancement but no obstructive findings or mass lesions. The appendix is normal. Vascular/Lymphatic: The aorta is normal in caliber. No dissection. The branch vessels are patent. The major venous structures are patent. No mesenteric or retroperitoneal mass or adenopathy. Small scattered lymph nodes are noted. Reproductive: Stable markedly enlarged fibroid uterus. The ovaries appear normal. Other: No abdominal or pelvic abscess or  free air. Musculoskeletal: No significant bony findings. Review of the MIP images confirms the above findings. IMPRESSION: 1. No CT findings for pulmonary embolism. 2. Normal thoracic aorta. 3. No acute pulmonary findings. 4. Diffuse inflammatory process  involving the colon most consistent with active ulcerative colitis. No mass or obstruction. 5. Stable enlarged fibroid uterus. Electronically Signed   By: Marijo Sanes M.D.   On: 03/11/2019 21:55   Ct Abdomen Pelvis W Contrast  Result Date: 03/11/2019 CLINICAL DATA:  Inflammatory bowel disease. History of ulcerative colitis. Fever, tachycardia, diarrhea. Chest pain. EXAM: CT ANGIOGRAPHY CHEST CT ABDOMEN AND PELVIS WITH CONTRAST TECHNIQUE: Multidetector CT imaging of the chest was performed using the standard protocol during bolus administration of intravenous contrast. Multiplanar CT image reconstructions and MIPs were obtained to evaluate the vascular anatomy. Multidetector CT imaging of the abdomen and pelvis was performed using the standard protocol during bolus administration of intravenous contrast. CONTRAST:  18m OMNIPAQUE IOHEXOL 350 MG/ML SOLN COMPARISON:  CT scan 08/18/2017 FINDINGS: CTA CHEST FINDINGS Cardiovascular: The heart is normal in size. No pericardial effusion. The aorta is normal in caliber. No dissection. No atherosclerotic calcifications. Branch vessels are patent. No coronary artery calcifications. The pulmonary arterial tree is fairly well opacified. No filling defects to suggest pulmonary embolism. Mediastinum/Nodes: No mediastinal or hilar mass or adenopathy. The esophagus is grossly normal. Lungs/Pleura: The lungs are clear. No infiltrates, edema or effusions. No worrisome pulmonary lesions. Musculoskeletal: No breast masses, supraclavicular or axillary adenopathy. The bony thorax is intact. Review of the MIP images confirms the above findings. CT ABDOMEN and PELVIS FINDINGS Hepatobiliary: Simple appearing hepatic cyst is noted. No  worrisome hepatic lesions. The gallbladder surgically absent. Mild associated intra and extrahepatic biliary dilatation. Pancreas: No mass, inflammation or ductal dilatation. Spleen: Normal size.  No focal lesions. Adrenals/Urinary Tract: The adrenal glands and kidneys are unremarkable. The bladder appears normal. Stomach/Bowel: The stomach, duodenum and small bowel are unremarkable. No acute inflammatory changes or obstructive findings. Diffuse inflammation of the colon is noted consistent with known diagnosis of ulcerative colitis. Marked wall thickening with mucosal and serosal enhancement but no obstructive findings or mass lesions. The appendix is normal. Vascular/Lymphatic: The aorta is normal in caliber. No dissection. The branch vessels are patent. The major venous structures are patent. No mesenteric or retroperitoneal mass or adenopathy. Small scattered lymph nodes are noted. Reproductive: Stable markedly enlarged fibroid uterus. The ovaries appear normal. Other: No abdominal or pelvic abscess or free air. Musculoskeletal: No significant bony findings. Review of the MIP images confirms the above findings. IMPRESSION: 1. No CT findings for pulmonary embolism. 2. Normal thoracic aorta. 3. No acute pulmonary findings. 4. Diffuse inflammatory process involving the colon most consistent with active ulcerative colitis. No mass or obstruction. 5. Stable enlarged fibroid uterus. Electronically Signed   By: PMarijo SanesM.D.   On: 03/11/2019 21:55    Scheduled Meds:  enoxaparin (LOVENOX) injection  40 mg Subcutaneous Q24H   methylPREDNISolone (SOLU-MEDROL) injection  40 mg Intravenous Q12H   Continuous Infusions:  cefTRIAXone (ROCEPHIN)  IV Stopped (03/12/19 2227)   metronidazole 500 mg (03/13/19 1809)     LOS: 2 days   SMarylu Lund MD [facility] Pager On Amion  If 7PM-7AM, please contact night-coverage 03/13/2019, 7:06 PM

## 2019-03-14 LAB — CBC WITH DIFFERENTIAL/PLATELET
Abs Immature Granulocytes: 0.81 10*3/uL — ABNORMAL HIGH (ref 0.00–0.07)
Basophils Absolute: 0.1 10*3/uL (ref 0.0–0.1)
Basophils Relative: 0 %
Eosinophils Absolute: 0 10*3/uL (ref 0.0–0.5)
Eosinophils Relative: 0 %
HCT: 30.9 % — ABNORMAL LOW (ref 36.0–46.0)
Hemoglobin: 10.5 g/dL — ABNORMAL LOW (ref 12.0–15.0)
Immature Granulocytes: 4 %
Lymphocytes Relative: 6 %
Lymphs Abs: 1.3 10*3/uL (ref 0.7–4.0)
MCH: 27.6 pg (ref 26.0–34.0)
MCHC: 34 g/dL (ref 30.0–36.0)
MCV: 81.1 fL (ref 80.0–100.0)
Monocytes Absolute: 1 10*3/uL (ref 0.1–1.0)
Monocytes Relative: 5 %
Neutro Abs: 18.3 10*3/uL — ABNORMAL HIGH (ref 1.7–7.7)
Neutrophils Relative %: 85 %
Platelets: 369 10*3/uL (ref 150–400)
RBC: 3.81 MIL/uL — ABNORMAL LOW (ref 3.87–5.11)
RDW: 14.1 % (ref 11.5–15.5)
WBC: 21.4 10*3/uL — ABNORMAL HIGH (ref 4.0–10.5)
nRBC: 0 % (ref 0.0–0.2)

## 2019-03-14 LAB — COMPREHENSIVE METABOLIC PANEL
ALT: 18 U/L (ref 0–44)
AST: 12 U/L — ABNORMAL LOW (ref 15–41)
Albumin: 2.5 g/dL — ABNORMAL LOW (ref 3.5–5.0)
Alkaline Phosphatase: 83 U/L (ref 38–126)
Anion gap: 11 (ref 5–15)
BUN: 9 mg/dL (ref 6–20)
CO2: 22 mmol/L (ref 22–32)
Calcium: 8.4 mg/dL — ABNORMAL LOW (ref 8.9–10.3)
Chloride: 106 mmol/L (ref 98–111)
Creatinine, Ser: 0.83 mg/dL (ref 0.44–1.00)
GFR calc Af Amer: >60 mL/min (ref >60)
GFR calc non Af Amer: >60 mL/min (ref >60)
Glucose, Bld: 163 mg/dL — ABNORMAL HIGH (ref 70–99)
Potassium: 3.1 mmol/L — ABNORMAL LOW (ref 3.5–5.1)
Sodium: 139 mmol/L (ref 135–145)
Total Bilirubin: 0.5 mg/dL (ref 0.3–1.2)
Total Protein: 5.7 g/dL — ABNORMAL LOW (ref 6.5–8.1)

## 2019-03-14 LAB — D-DIMER, QUANTITATIVE: D-Dimer, Quant: 0.82 ug/mL-FEU — ABNORMAL HIGH (ref 0.00–0.50)

## 2019-03-14 LAB — FERRITIN: Ferritin: 104 ng/mL (ref 11–307)

## 2019-03-14 LAB — C-REACTIVE PROTEIN: CRP: 8.5 mg/dL — ABNORMAL HIGH (ref <1.0)

## 2019-03-14 MED ORDER — VITAMIN C 250 MG PO TABS: 250.0000 mg | ORAL_TABLET | Freq: Every day | ORAL | 0 refills | Status: AC

## 2019-03-14 MED ORDER — PREDNISONE 10 MG PO TABS: ORAL_TABLET | ORAL | Status: DC

## 2019-03-14 MED ORDER — POTASSIUM CHLORIDE CRYS ER 20 MEQ PO TBCR
40.0000 meq | EXTENDED_RELEASE_TABLET | Freq: Two times a day (BID) | ORAL | Status: DC
  Administered 2019-03-14: 40 meq via ORAL
  Filled 2019-03-14: qty 2

## 2019-03-14 MED ORDER — ZINC SULFATE 220 (50 ZN) MG PO TABS: 1.0000 | ORAL_TABLET | Freq: Every day | ORAL | 0 refills | Status: DC

## 2019-03-14 NOTE — Plan of Care (Signed)
  Problem: Education: Goal: Knowledge of General Education information will improve Description: Including pain rating scale, medication(s)/side effects and non-pharmacologic comfort measures Outcome: Progressing   Problem: Health Behavior/Discharge Planning: Goal: Ability to manage health-related needs will improve Outcome: Progressing   Problem: Coping: Goal: Psychosocial and spiritual needs will be supported Outcome: Progressing

## 2019-03-14 NOTE — Discharge Instructions (Signed)
COVID-19 COVID-19 is a respiratory infection that is caused by a virus called severe acute respiratory syndrome coronavirus 2 (SARS-CoV-2). The disease is also known as coronavirus disease or novel coronavirus. In some people, the virus may not cause any symptoms. In others, it may cause a serious infection. The infection can get worse quickly and can lead to complications, such as:  Pneumonia, or infection of the lungs.  Acute respiratory distress syndrome or ARDS. This is fluid build-up in the lungs.  Acute respiratory failure. This is a condition in which there is not enough oxygen passing from the lungs to the body.  Sepsis or septic shock. This is a serious bodily reaction to an infection.  Blood clotting problems.  Secondary infections due to bacteria or fungus. The virus that causes COVID-19 is contagious. This means that it can spread from person to person through droplets from coughs and sneezes (respiratory secretions). What are the causes? This illness is caused by a virus. You may catch the virus by:  Breathing in droplets from an infected person's cough or sneeze.  Touching something, like a table or a doorknob, that was exposed to the virus (contaminated) and then touching your mouth, nose, or eyes. What increases the risk? Risk for infection You are more likely to be infected with this virus if you:  Live in or travel to an area with a COVID-19 outbreak.  Come in contact with a sick person who recently traveled to an area with a COVID-19 outbreak.  Provide care for or live with a person who is infected with COVID-19. Risk for serious illness You are more likely to become seriously ill from the virus if you:  Are 50 years of age or older.  Have a long-term disease that lowers your body's ability to fight infection (immunocompromised).  Live in a nursing home or long-term care facility.  Have a long-term (chronic) disease such as: ? Chronic lung disease, including  chronic obstructive pulmonary disease or asthma ? Heart disease. ? Diabetes. ? Chronic kidney disease. ? Liver disease.  Are obese. What are the signs or symptoms? Symptoms of this condition can range from mild to severe. Symptoms may appear any time from 2 to 14 days after being exposed to the virus. They include:  A fever.  A cough.  Difficulty breathing.  Chills.  Muscle pains.  A sore throat.  Loss of taste or smell. Some people may also have stomach problems, such as nausea, vomiting, or diarrhea. Other people may not have any symptoms of COVID-19. How is this diagnosed? This condition may be diagnosed based on:  Your signs and symptoms, especially if: ? You live in an area with a COVID-19 outbreak. ? You recently traveled to or from an area where the virus is common. ? You provide care for or live with a person who was diagnosed with COVID-19.  A physical exam.  Lab tests, which may include: ? A nasal swab to take a sample of fluid from your nose. ? A throat swab to take a sample of fluid from your throat. ? A sample of mucus from your lungs (sputum). ? Blood tests.  Imaging tests, which may include, X-rays, CT scan, or ultrasound. How is this treated? At present, there is no medicine to treat COVID-19. Medicines that treat other diseases are being used on a trial basis to see if they are effective against COVID-19. Your health care provider will talk with you about ways to treat your symptoms. For most  people, the infection is mild and can be managed at home with rest, fluids, and over-the-counter medicines. Treatment for a serious infection usually takes places in a hospital intensive care unit (ICU). It may include one or more of the following treatments. These treatments are given until your symptoms improve.  Receiving fluids and medicines through an IV.  Supplemental oxygen. Extra oxygen is given through a tube in the nose, a face mask, or a  hood.  Positioning you to lie on your stomach (prone position). This makes it easier for oxygen to get into the lungs.  Continuous positive airway pressure (CPAP) or bi-level positive airway pressure (BPAP) machine. This treatment uses mild air pressure to keep the airways open. A tube that is connected to a motor delivers oxygen to the body.  Ventilator. This treatment moves air into and out of the lungs by using a tube that is placed in your windpipe.  Tracheostomy. This is a procedure to create a hole in the neck so that a breathing tube can be inserted.  Extracorporeal membrane oxygenation (ECMO). This procedure gives the lungs a chance to recover by taking over the functions of the heart and lungs. It supplies oxygen to the body and removes carbon dioxide. Follow these instructions at home: Lifestyle  If you are sick, stay home except to get medical care. Your health care provider will tell you how long to stay home. Call your health care provider before you go for medical care.  Rest at home as told by your health care provider.  Do not use any products that contain nicotine or tobacco, such as cigarettes, e-cigarettes, and chewing tobacco. If you need help quitting, ask your health care provider.  Return to your normal activities as told by your health care provider. Ask your health care provider what activities are safe for you. General instructions  Take over-the-counter and prescription medicines only as told by your health care provider.  Drink enough fluid to keep your urine pale yellow.  Keep all follow-up visits as told by your health care provider. This is important. How is this prevented?  There is no vaccine to help prevent COVID-19 infection. However, there are steps you can take to protect yourself and others from this virus. To protect yourself:   Do not travel to areas where COVID-19 is a risk. The areas where COVID-19 is reported change often. To identify  high-risk areas and travel restrictions, check the CDC travel website: FatFares.com.br  If you live in, or must travel to, an area where COVID-19 is a risk, take precautions to avoid infection. ? Stay away from people who are sick. ? Wash your hands often with soap and water for 20 seconds. If soap and water are not available, use an alcohol-based hand sanitizer. ? Avoid touching your mouth, face, eyes, or nose. ? Avoid going out in public, follow guidance from your state and local health authorities. ? If you must go out in public, wear a cloth face covering or face mask. ? Disinfect objects and surfaces that are frequently touched every day. This may include:  Counters and tables.  Doorknobs and light switches.  Sinks and faucets.  Electronics, such as phones, remote controls, keyboards, computers, and tablets. To protect others: If you have symptoms of COVID-19, take steps to prevent the virus from spreading to others.  If you think you have a COVID-19 infection, contact your health care provider right away. Tell your health care team that you think you may  have a COVID-19 infection.  Stay home. Leave your house only to seek medical care. Do not use public transport.  Do not travel while you are sick.  Wash your hands often with soap and water for 20 seconds. If soap and water are not available, use alcohol-based hand sanitizer.  Stay away from other members of your household. Let healthy household members care for children and pets, if possible. If you have to care for children or pets, wash your hands often and wear a mask. If possible, stay in your own room, separate from others. Use a different bathroom.  Make sure that all people in your household wash their hands well and often.  Cough or sneeze into a tissue or your sleeve or elbow. Do not cough or sneeze into your hand or into the air.  Wear a cloth face covering or face mask. Where to find more  information  Centers for Disease Control and Prevention: PurpleGadgets.be  World Health Organization: https://www.castaneda.info/ Contact a health care provider if:  You live in or have traveled to an area where COVID-19 is a risk and you have symptoms of the infection.  You have had contact with someone who has COVID-19 and you have symptoms of the infection. Get help right away if:  You have trouble breathing.  You have pain or pressure in your chest.  You have confusion.  You have bluish lips and fingernails.  You have difficulty waking from sleep.  You have symptoms that get worse. These symptoms may represent a serious problem that is an emergency. Do not wait to see if the symptoms will go away. Get medical help right away. Call your local emergency services (911 in the [place] Do not drive yourself to the hospital. Let the emergency medical personnel know if you think you have COVID-19. Summary  COVID-19 is a respiratory infection that is caused by a virus. It is also known as coronavirus disease or novel coronavirus. It can cause serious infections, such as pneumonia, acute respiratory distress syndrome, acute respiratory failure, or sepsis.  The virus that causes COVID-19 is contagious. This means that it can spread from person to person through droplets from coughs and sneezes.  You are more likely to develop a serious illness if you are 74 years of age or older, have a weak immunity, live in a nursing home, or have chronic disease.  There is no medicine to treat COVID-19. Your health care provider will talk with you about ways to treat your symptoms.  Take steps to protect yourself and others from infection. Wash your hands often and disinfect objects and surfaces that are frequently touched every day. Stay away from people who are sick and wear a mask if you are sick. This information is not intended to replace advice given to you by  your health care provider. Make sure you discuss any questions you have with your health care provider. Document Released: 05/30/2018 Document Revised: 09/19/2018 Document Reviewed: 05/30/2018 Elsevier Patient Education  2020 [place]: How to Protect Yourself and Others Know how it spreads  There is currently no vaccine to prevent coronavirus disease 2019 (COVID-19).  The best way to prevent illness is to avoid being exposed to this virus.  The virus is thought to spread mainly from person-to-person. ? Between people who are in close contact with one another (within about 6 feet). ? Through respiratory droplets produced when an infected person coughs, sneezes or talks. ? These droplets can land in  the mouths or noses of people who are nearby or possibly be inhaled into the lungs. ? Some recent studies have suggested that COVID-19 may be spread by people who are not showing symptoms. Everyone should Clean your hands often  Wash your hands often with soap and water for at least 20 seconds especially after you have been in a public place, or after blowing your nose, coughing, or sneezing.  If soap and water are not readily available, use a hand sanitizer that contains at least 60% alcohol. Cover all surfaces of your hands and rub them together until they feel dry.  Avoid touching your eyes, nose, and mouth with unwashed hands. Avoid close contact  Stay home if you are sick.  Avoid close contact with people who are sick.  Put distance between yourself and other people. ? Remember that some people without symptoms may be able to spread virus. ? This is especially important for people who are at higher risk of getting very GainPain.com.cy Cover your mouth and nose with a cloth face cover when around others  You could spread COVID-19 to others even if you do not feel sick.  Everyone should wear a  cloth face cover when they have to go out in public, for example to the grocery store or to pick up other necessities. ? Cloth face coverings should not be placed on young children under age 41, anyone who has trouble breathing, or is unconscious, incapacitated or otherwise unable to remove the mask without assistance.  The cloth face cover is meant to protect other people in case you are infected.  Do NOT use a facemask meant for a Dietitian.  Continue to keep about 6 feet between yourself and others. The cloth face cover is not a substitute for social distancing. Cover coughs and sneezes  If you are in a private setting and do not have on your cloth face covering, remember to always cover your mouth and nose with a tissue when you cough or sneeze or use the inside of your elbow.  Throw used tissues in the trash.  Immediately wash your hands with soap and water for at least 20 seconds. If soap and water are not readily available, clean your hands with a hand sanitizer that contains at least 60% alcohol. Clean and disinfect  Clean AND disinfect frequently touched surfaces daily. This includes tables, doorknobs, light switches, countertops, handles, desks, phones, keyboards, toilets, faucets, and sinks. RackRewards.fr  If surfaces are dirty, clean them: Use detergent or soap and water prior to disinfection.  Then, use a household disinfectant. You can see a list of EPA-registered household disinfectants here. michellinders.com 09/10/2018 This information is not intended to replace advice given to you by your health care provider. Make sure you discuss any questions you have with your health care provider. Document Released: 08/20/2018 Document Revised: 09/18/2018 Document Reviewed: 08/20/2018 Elsevier Patient Education  2020 [employer].   COVID-19 Frequently Asked Questions COVID-19 (coronavirus disease) is  an infection that is caused by a large family of viruses. Some viruses cause illness in people and others cause illness in animals like camels, cats, and bats. In some cases, the viruses that cause illness in animals can spread to humans. Where did the coronavirus come from? In December 2019, [place] told the [facility]) of several cases of lung disease (human respiratory illness). These cases were linked to an open seafood and livestock market in the city of [place]. The link to the seafood and  livestock market suggests that the virus may have spread from animals to humans. However, since that first outbreak in December, the virus has also been shown to spread from person to person. What is the name of the disease and the virus? Disease name Early on, this disease was called novel coronavirus. This is because scientists determined that the disease was caused by a new (novel) respiratory virus. The World Health Organization [facility]) has now named the disease COVID-19, or coronavirus disease. Virus name The virus that causes the disease is called severe acute respiratory syndrome coronavirus 2 (SARS-CoV-2). More information on disease and virus naming World Health Organization [facility]): www.who.int/emergencies/diseases/novel-coronavirus-2019/technical-guidance/naming-the-coronavirus-disease-(covid-2019)-and-the-virus-that-causes-it Who is at risk for complications from coronavirus disease? Some people may be at higher risk for complications from coronavirus disease. This includes older adults and people who have chronic diseases, such as heart disease, diabetes, and lung disease. If you are at higher risk for complications, take these extra precautions:  Avoid close contact with people who are sick or have a fever or cough. Stay at least 3-6 ft (1-2 m) away from them, if possible.  Wash your hands often with soap and water for at least 20 seconds.  Avoid touching your face, mouth, nose, or  eyes.  Keep supplies on hand at home, such as food, medicine, and cleaning supplies.  Stay home as much as possible.  Avoid social gatherings and travel. How does coronavirus disease spread? The virus that causes coronavirus disease spreads easily from person to person (is contagious). There are also cases of community-spread disease. This means the disease has spread to:  People who have no known contact with other infected people.  People who have not traveled to areas where there are known cases. It appears to spread from one person to another through droplets from coughing or sneezing. Can I get the virus from touching surfaces or objects? There is still a lot that we do not know about the virus that causes coronavirus disease. Scientists are basing a lot of information on what they know about similar viruses, such as:  Viruses cannot generally survive on surfaces for long. They need a human body (host) to survive.  It is more likely that the virus is spread by close contact with people who are sick (direct contact), such as through: ? Shaking hands or hugging. ? Breathing in respiratory droplets that travel through the air. This can happen when an infected person coughs or sneezes on or near other people.  It is less likely that the virus is spread when a person touches a surface or object that has the virus on it (indirect contact). The virus may be able to enter the body if the person touches a surface or object and then touches his or her face, eyes, nose, or mouth. Can a person spread the virus without having symptoms of the disease? It may be possible for the virus to spread before a person has symptoms of the disease, but this is most likely not the main way the virus is spreading. It is more likely for the virus to spread by being in close contact with people who are sick and breathing in the respiratory droplets of a sick person's cough or sneeze. What are the symptoms of  coronavirus disease? Symptoms vary from person to person and can range from mild to severe. Symptoms may include:  Fever.  Cough.  Tiredness, weakness, or fatigue.  Fast breathing or feeling short of breath. These symptoms can appear anywhere from  2 to 14 days after you have been exposed to the virus. If you develop symptoms, call your health care provider. People with severe symptoms may need hospital care. If I am exposed to the virus, how long does it take before symptoms start? Symptoms of coronavirus disease may appear anywhere from 2 to 14 days after a person has been exposed to the virus. If you develop symptoms, call your health care provider. Should I be tested for this virus? Your health care provider will decide whether to test you based on your symptoms, history of exposure, and your risk factors. How does a health care provider test for this virus? Health care providers will collect samples to send for testing. Samples may include:  Taking a swab of fluid from the nose.  Taking fluid from the lungs by having you cough up mucus (sputum) into a sterile cup.  Taking a blood sample.  Taking a stool or urine sample. Is there a treatment or vaccine for this virus? Currently, there is no vaccine to prevent coronavirus disease. Also, there are no medicines like antibiotics or antivirals to treat the virus. A person who becomes sick is given supportive care, which means rest and fluids. A person may also relieve his or her symptoms by using over-the-counter medicines that treat sneezing, coughing, and runny nose. These are the same medicines that a person takes for the common cold. If you develop symptoms, call your health care provider. People with severe symptoms may need hospital care. What can I do to protect myself and my family from this virus?     You can protect yourself and your family by taking the same actions that you would take to prevent the spread of other viruses.  Take the following actions:  Wash your hands often with soap and water for at least 20 seconds. If soap and water are not available, use alcohol-based hand sanitizer.  Avoid touching your face, mouth, nose, or eyes.  Cough or sneeze into a tissue, sleeve, or elbow. Do not cough or sneeze into your hand or the air. ? If you cough or sneeze into a tissue, throw it away immediately and wash your hands.  Disinfect objects and surfaces that you frequently touch every day.  Avoid close contact with people who are sick or have a fever or cough. Stay at least 3-6 ft (1-2 m) away from them, if possible.  Stay home if you are sick, except to get medical care. Call your health care provider before you get medical care.  Make sure your vaccines are up to date. Ask your health care provider what vaccines you need. What should I do if I need to travel? Follow travel recommendations from your local health authority, the CDC, and WHO. Travel information and advice  Centers for Disease Control and Prevention (CDC): BodyEditor.hu  World Health Organization [facility]): ThirdIncome.ca Know the risks and take action to protect your health  You are at higher risk of getting coronavirus disease if you are traveling to areas with an outbreak or if you are exposed to travelers from areas with an outbreak.  Wash your hands often and practice good hygiene to lower the risk of catching or spreading the virus. What should I do if I am sick? General instructions to stop the spread of infection  Wash your hands often with soap and water for at least 20 seconds. If soap and water are not available, use alcohol-based hand sanitizer.  Cough or sneeze into a  tissue, sleeve, or elbow. Do not cough or sneeze into your hand or the air.  If you cough or sneeze into a tissue, throw it away immediately and wash your hands.  Stay  home unless you must get medical care. Call your health care provider or local health authority before you get medical care.  Avoid public areas. Do not take public transportation, if possible.  If you can, wear a mask if you must go out of the house or if you are in close contact with someone who is not sick. Keep your home clean  Disinfect objects and surfaces that are frequently touched every day. This may include: ? Counters and tables. ? Doorknobs and light switches. ? Sinks and faucets. ? Electronics such as phones, remote controls, keyboards, computers, and tablets.  Wash dishes in hot, soapy water or use a dishwasher. Air-dry your dishes.  Wash laundry in hot water. Prevent infecting other household members  Let healthy household members care for children and pets, if possible. If you have to care for children or pets, wash your hands often and wear a mask.  Sleep in a different bedroom or bed, if possible.  Do not share personal items, such as razors, toothbrushes, deodorant, combs, brushes, towels, and washcloths. Where to find more information Centers for Disease Control and Prevention (CDC)  Information and news updates: https://www.butler-gonzalez.com/ World Health Organization [facility])  Information and news updates: MissExecutive.com.ee  Coronavirus health topic: https://www.castaneda.info/  Questions and answers on COVID-19: OpportunityDebt.at  Global tracker: who.sprinklr.com American Academy of Pediatrics (AAP)  Information for families: www.healthychildren.org/English/health-issues/conditions/chest-lungs/Pages/2019-Novel-Coronavirus.aspx The coronavirus situation is changing rapidly. Check your local health authority website or the CDC and [facility] websites for updates and news. When should I contact a health care provider?  Contact your health care provider if you have symptoms of an  infection, such as fever or cough, and you: ? Have been near anyone who is known to have coronavirus disease. ? Have come into contact with a person who is suspected to have coronavirus disease. ? Have traveled outside of the country. When should I get emergency medical care?  Get help right away by calling your local emergency services (911 in the [place] if you have: ? Trouble breathing. ? Pain or pressure in your chest. ? Confusion. ? Blue-tinged lips and fingernails. ? Difficulty waking from sleep. ? Symptoms that get worse. Let the emergency medical personnel know if you think you have coronavirus disease. Summary  A new respiratory virus is spreading from person to person and causing COVID-19 (coronavirus disease).  The virus that causes COVID-19 appears to spread easily. It spreads from one person to another through droplets from coughing or sneezing.  Older adults and those with chronic diseases are at higher risk of disease. If you are at higher risk for complications, take extra precautions.  There is currently no vaccine to prevent coronavirus disease. There are no medicines, such as antibiotics or antivirals, to treat the virus.  You can protect yourself and your family by washing your hands often, avoiding touching your face, and covering your coughs and sneezes. This information is not intended to replace advice given to you by your health care provider. Make sure you discuss any questions you have with your health care provider. Document Released: 08/20/2018 Document Revised: 08/20/2018 Document Reviewed: 08/20/2018 Elsevier Patient Education  [place].

## 2019-03-14 NOTE — Discharge Summary (Signed)
Physician Discharge Summary  Annette Thomas HBZ:[id number] DOB: 1970/05/10 DOA: 03/11/2019  PCP: Harlan Stains, MD  Admit date: 03/11/2019 Discharge date: 03/14/2019  Admitted From: Home Disposition:  Home  Recommendations for Outpatient Follow-up:  1. Follow up with PCP in 1-2 weeks 2. Follow up with GI as scheduled  Discharge Condition:Improved CODE STATUS:Full Diet recommendation: Soft, advance as tolerated   Brief/Interim Summary: 48 y.o.femalewith medical history significant ofulcerative colitis, hypertension, fibroids recently tested positive for COVID-19 on 10/18 presenting with complaints of abdominal pain. Patient reports 6-day history of bilateral lower quadrant abdominal pain. She has been having multiple episodes of clay colored, nonbloody diarrhea. She vomited once but continues to feel nauseous. She has been having fevers for the past few days. States she is seen by Dr. Trula Slade from [facility] for her ulcerative colitis. States she tested positive for COVID-19 on 10/18 and her husband is currently admitted to [facility]. She is currently not having any respiratory complaints. Denies shortness of breath or cough.  ED Course:Temperature 103.1 F, tachycardic. White blood cell count 13.3. Procalcitonin 0.62. CRP 27.8. Lactic acid x2 normal. Potassium 3.1. C. difficile PCR and GI pathogen panel pending. UA not suggestive of infection. Urine culture pending. Blood culture x2 pending. Chest x-ray showing no acute cardiopulmonary abnormality. CT angiogram negative for PE and no acute pulmonary findings. CT abdomen showing diffuse inflammatory process involving the colon most consistent with active ulcerative colitis. No mass or obstruction. Patient received Tylenol, morphine, Zofran, potassium supplementation, ceftriaxone, Flagyl, and 2 L normal saline boluses at time of presentation   Discharge Diagnoses:  Principal Problem:   Exacerbation of ulcerative  colitis ([place]) Active Problems:   Hypokalemia   Lab test positive for detection of COVID-19 virus   Sepsis ([place])   HTN (hypertension)   Sepsis secondary to acute exacerbation of ulcerative colitis -Clinically much improved -Appreciate GI assistance. Recommendation for prednisone 68m once a day with recommended taper as follows: 496mdaily x 1 week, then 3069maily x 11 week, then 55m47mily x 1 week, then 10mg75mly x 1 week -Advance diet. GI has since signed off  Mild hypokalemia -Likely secondary to GI lossesfrom vomiting/diarrhea.  -Replaced -Stable  Recent COVID-19 viral infection -Patient reports testing positive for COVID-19 on 10/18.  -Currently no respiratory symptoms and no signs of respiratory distress.  -CT angiogram without acute pulmonary findings, reviewed -Repeat SARS-CoV-2 test is pos -CRP peaked to 23.8, improved to 8.5 overnight  Hypertension -Currently normotensive.  -Held antihypertensives in the setting of presenting sepsis while in hospital -Stable at this time  HIV screening -HIV is neg, reviewed  Hypokalemia -Replaced   Discharge Instructions   Allergies as of 03/14/2019   No Known Allergies     Medication List    STOP taking these medications   HYDROcodone-acetaminophen 5-325 MG tablet Commonly known as: NORCO/VICODIN   naproxen 500 MG tablet Commonly known as: NAPROSYN     TAKE these medications   amLODipine 5 MG tablet Commonly known as: NORVASC Take 5 mg by mouth daily.   Cetirizine HCl 10 MG Caps Take 1 capsule (10 mg total) by mouth daily for 10 days.   dicyclomine 20 MG tablet Commonly known as: BENTYL Take 20 mg by mouth every 6 (six) hours as needed.   fluticasone 50 MCG/ACT nasal spray Commonly known as: FLONASE Place 1-2 sprays into both nostrils daily for 7 days.   Lialda 1.2 g EC tablet Generic drug: mesalamine Take 2.4 g by mouth daily.  multivitamin tablet Take 1 tablet by mouth daily.    predniSONE 10 MG tablet Commonly known as: DELTASONE Taper dose: 72m po daily x 7 days, then 365mpo daily x 7 days, then 2012mo daily x 7 days, then 58m62m daily x 7 days, then stop. Zero refills   VITAMIN D (ERGOCALCIFEROL) PO Take 5,000 Units by mouth daily.       No Known Allergies  Consultations:  GI  Procedures/Studies: Ct Angio Chest Pe W/cm &/or Wo Cm  Result Date: 03/11/2019 CLINICAL DATA:  Inflammatory bowel disease. History of ulcerative colitis. Fever, tachycardia, diarrhea. Chest pain. EXAM: CT ANGIOGRAPHY CHEST CT ABDOMEN AND PELVIS WITH CONTRAST TECHNIQUE: Multidetector CT imaging of the chest was performed using the standard protocol during bolus administration of intravenous contrast. Multiplanar CT image reconstructions and MIPs were obtained to evaluate the vascular anatomy. Multidetector CT imaging of the abdomen and pelvis was performed using the standard protocol during bolus administration of intravenous contrast. CONTRAST:  100mL24mIPAQUE IOHEXOL 350 MG/ML SOLN COMPARISON:  CT scan 08/18/2017 FINDINGS: CTA CHEST FINDINGS Cardiovascular: The heart is normal in size. No pericardial effusion. The aorta is normal in caliber. No dissection. No atherosclerotic calcifications. Branch vessels are patent. No coronary artery calcifications. The pulmonary arterial tree is fairly well opacified. No filling defects to suggest pulmonary embolism. Mediastinum/Nodes: No mediastinal or hilar mass or adenopathy. The esophagus is grossly normal. Lungs/Pleura: The lungs are clear. No infiltrates, edema or effusions. No worrisome pulmonary lesions. Musculoskeletal: No breast masses, supraclavicular or axillary adenopathy. The bony thorax is intact. Review of the MIP images confirms the above findings. CT ABDOMEN and PELVIS FINDINGS Hepatobiliary: Simple appearing hepatic cyst is noted. No worrisome hepatic lesions. The gallbladder surgically absent. Mild associated intra and extrahepatic  biliary dilatation. Pancreas: No mass, inflammation or ductal dilatation. Spleen: Normal size.  No focal lesions. Adrenals/Urinary Tract: The adrenal glands and kidneys are unremarkable. The bladder appears normal. Stomach/Bowel: The stomach, duodenum and small bowel are unremarkable. No acute inflammatory changes or obstructive findings. Diffuse inflammation of the colon is noted consistent with known diagnosis of ulcerative colitis. Marked wall thickening with mucosal and serosal enhancement but no obstructive findings or mass lesions. The appendix is normal. Vascular/Lymphatic: The aorta is normal in caliber. No dissection. The branch vessels are patent. The major venous structures are patent. No mesenteric or retroperitoneal mass or adenopathy. Small scattered lymph nodes are noted. Reproductive: Stable markedly enlarged fibroid uterus. The ovaries appear normal. Other: No abdominal or pelvic abscess or free air. Musculoskeletal: No significant bony findings. Review of the MIP images confirms the above findings. IMPRESSION: 1. No CT findings for pulmonary embolism. 2. Normal thoracic aorta. 3. No acute pulmonary findings. 4. Diffuse inflammatory process involving the colon most consistent with active ulcerative colitis. No mass or obstruction. 5. Stable enlarged fibroid uterus. Electronically Signed   By: P.  GMarijo Sanes   On: 03/11/2019 21:55   Ct Abdomen Pelvis W Contrast  Result Date: 03/11/2019 CLINICAL DATA:  Inflammatory bowel disease. History of ulcerative colitis. Fever, tachycardia, diarrhea. Chest pain. EXAM: CT ANGIOGRAPHY CHEST CT ABDOMEN AND PELVIS WITH CONTRAST TECHNIQUE: Multidetector CT imaging of the chest was performed using the standard protocol during bolus administration of intravenous contrast. Multiplanar CT image reconstructions and MIPs were obtained to evaluate the vascular anatomy. Multidetector CT imaging of the abdomen and pelvis was performed using the standard protocol  during bolus administration of intravenous contrast. CONTRAST:  100mL 76mPAQUE IOHEXOL 350 MG/ML SOLN COMPARISON:  CT scan 08/18/2017 FINDINGS: CTA CHEST FINDINGS Cardiovascular: The heart is normal in size. No pericardial effusion. The aorta is normal in caliber. No dissection. No atherosclerotic calcifications. Branch vessels are patent. No coronary artery calcifications. The pulmonary arterial tree is fairly well opacified. No filling defects to suggest pulmonary embolism. Mediastinum/Nodes: No mediastinal or hilar mass or adenopathy. The esophagus is grossly normal. Lungs/Pleura: The lungs are clear. No infiltrates, edema or effusions. No worrisome pulmonary lesions. Musculoskeletal: No breast masses, supraclavicular or axillary adenopathy. The bony thorax is intact. Review of the MIP images confirms the above findings. CT ABDOMEN and PELVIS FINDINGS Hepatobiliary: Simple appearing hepatic cyst is noted. No worrisome hepatic lesions. The gallbladder surgically absent. Mild associated intra and extrahepatic biliary dilatation. Pancreas: No mass, inflammation or ductal dilatation. Spleen: Normal size.  No focal lesions. Adrenals/Urinary Tract: The adrenal glands and kidneys are unremarkable. The bladder appears normal. Stomach/Bowel: The stomach, duodenum and small bowel are unremarkable. No acute inflammatory changes or obstructive findings. Diffuse inflammation of the colon is noted consistent with known diagnosis of ulcerative colitis. Marked wall thickening with mucosal and serosal enhancement but no obstructive findings or mass lesions. The appendix is normal. Vascular/Lymphatic: The aorta is normal in caliber. No dissection. The branch vessels are patent. The major venous structures are patent. No mesenteric or retroperitoneal mass or adenopathy. Small scattered lymph nodes are noted. Reproductive: Stable markedly enlarged fibroid uterus. The ovaries appear normal. Other: No abdominal or pelvic abscess or  free air. Musculoskeletal: No significant bony findings. Review of the MIP images confirms the above findings. IMPRESSION: 1. No CT findings for pulmonary embolism. 2. Normal thoracic aorta. 3. No acute pulmonary findings. 4. Diffuse inflammatory process involving the colon most consistent with active ulcerative colitis. No mass or obstruction. 5. Stable enlarged fibroid uterus. Electronically Signed   By: Marijo Sanes M.D.   On: 03/11/2019 21:55   Dg Chest Port 1 View  Result Date: 03/11/2019 CLINICAL DATA:  COVID-19 positive 02/23/2019 EXAM: PORTABLE CHEST 1 VIEW COMPARISON:  None. FINDINGS: No consolidation, features of edema, pneumothorax, or effusion. Pulmonary vascularity is normally distributed. The cardiomediastinal contours are unremarkable. No acute osseous or soft tissue abnormality. IMPRESSION: No acute cardiopulmonary abnormality. Electronically Signed   By: Lovena Le M.D.   On: 03/11/2019 18:08   Dg Chest Portable 1 View  Result Date: 03/08/2019 CLINICAL DATA:  Abdominal spasms. The patient was COVID-19 positive February 23, 2019. EXAM: PORTABLE CHEST 1 VIEW COMPARISON:  February 25, 2015 FINDINGS: The heart size and mediastinal contours are within normal limits. Both lungs are clear. The visualized skeletal structures are unremarkable. IMPRESSION: No active disease. Electronically Signed   By: Dorise Bullion III M.D   On: 03/08/2019 21:28     Subjective: Eager to go home  Discharge Exam: Vitals:   03/13/19 2340 03/14/19 0749  BP: 118/76 121/70  Pulse: 76 71  Resp:    Temp: 98.6 F (37 C) 97.7 F (36.5 C)  SpO2: 100% 100%   Vitals:   03/13/19 0802 03/13/19 1500 03/13/19 2340 03/14/19 0749  BP: 121/74 122/78 118/76 121/70  Pulse: 78 80 76 71  Resp:  19    Temp: 97.9 F (36.6 C) 98 F (36.7 C) 98.6 F (37 C) 97.7 F (36.5 C)  TempSrc:  Oral Oral Oral  SpO2: 100% 100% 100% 100%  Weight:      Height:        General: Pt is alert, awake, not in acute  distress Cardiovascular: RRR,  S1/S2 +, no rubs, no gallops Respiratory: CTA bilaterally, no wheezing, no rhonchi Abdominal: Soft, NT, ND, bowel sounds + Extremities: no edema, no cyanosis   The results of significant diagnostics from this hospitalization (including imaging, microbiology, ancillary and laboratory) are listed below for reference.     Microbiology: Recent Results (from the past 240 hour(s))  Blood Culture (routine x 2)     Status: None (Preliminary result)   Collection Time: 03/11/19  6:01 PM   Specimen: BLOOD  Result Value Ref Range Status   Specimen Description BLOOD LEFT ANTECUBITAL  Final   Special Requests   Final    BOTTLES DRAWN AEROBIC AND ANAEROBIC Blood Culture adequate volume   Culture   Final    NO GROWTH 2 DAYS Performed at [facility], [address] [place], [place] [address]    Report Status PENDING  Incomplete  Blood Culture (routine x 2)     Status: None (Preliminary result)   Collection Time: 03/11/19  6:18 PM   Specimen: BLOOD  Result Value Ref Range Status   Specimen Description BLOOD RIGHT ANTECUBITAL  Final   Special Requests   Final    BOTTLES DRAWN AEROBIC AND ANAEROBIC Blood Culture adequate volume   Culture   Final    NO GROWTH 2 DAYS Performed at [facility], [place] [address] [place], [place] [address]    Report Status PENDING  Incomplete  Urine culture     Status: None   Collection Time: 03/11/19  9:26 PM   Specimen: Urine, Clean Catch  Result Value Ref Range Status   Specimen Description URINE, CLEAN CATCH  Final   Special Requests NONE  Final   Culture   Final    NO GROWTH Performed at [facility], [place] [address], [place], [place] [address]    Report Status 03/12/2019 FINAL  Final  Gastrointestinal Panel by PCR , Stool     Status: None   Collection Time: 03/12/19  2:12 AM   Specimen: Stool  Result Value Ref Range Status   Campylobacter species NOT DETECTED NOT DETECTED Final   Plesimonas shigelloides  NOT DETECTED NOT DETECTED Final   Salmonella species NOT DETECTED NOT DETECTED Final   Yersinia enterocolitica NOT DETECTED NOT DETECTED Final   Vibrio species NOT DETECTED NOT DETECTED Final   Vibrio cholerae NOT DETECTED NOT DETECTED Final   Enteroaggregative E coli (EAEC) NOT DETECTED NOT DETECTED Final   Enteropathogenic E coli (EPEC) NOT DETECTED NOT DETECTED Final   Enterotoxigenic E coli (ETEC) NOT DETECTED NOT DETECTED Final   Shiga like toxin producing E coli (STEC) NOT DETECTED NOT DETECTED Final   Shigella/Enteroinvasive E coli (EIEC) NOT DETECTED NOT DETECTED Final   Cryptosporidium NOT DETECTED NOT DETECTED Final   Cyclospora cayetanensis NOT DETECTED NOT DETECTED Final   Entamoeba histolytica NOT DETECTED NOT DETECTED Final   Giardia lamblia NOT DETECTED NOT DETECTED Final   Adenovirus F40/41 NOT DETECTED NOT DETECTED Final   Astrovirus NOT DETECTED NOT DETECTED Final   Norovirus GI/GII NOT DETECTED NOT DETECTED Final   Rotavirus A NOT DETECTED NOT DETECTED Final   Sapovirus (I, II, IV, and V) NOT DETECTED NOT DETECTED Final    Comment: Performed at [facility], [place] [place], [place] [address]  C Difficile Quick Screen w PCR reflex     Status: None   Collection Time: 03/12/19  2:12 AM   Specimen: Stool  Result Value Ref Range Status  C Diff antigen NEGATIVE NEGATIVE Final   C Diff toxin NEGATIVE NEGATIVE Final   C Diff interpretation No C. difficile detected.  Final    Comment: Performed at [facility], [place] [address], [place], [place] [address]  SARS CORONAVIRUS 2 (TAT 6-24 HRS) Nasopharyngeal Nasopharyngeal Swab     Status: Abnormal   Collection Time: 03/12/19  8:01 AM   Specimen: Nasopharyngeal Swab  Result Value Ref Range Status   SARS Coronavirus 2 POSITIVE (A) NEGATIVE Final    Comment: RESULT CALLED TO, READ BACK BY AND VERIFIED WITH: Precious Reel RN 14:30 03/12/19 ([email or web address]) (NOTE) SARS-CoV-2 target nucleic acids are DETECTED. The  SARS-CoV-2 RNA is generally detectable in upper and lower respiratory specimens during the acute phase of infection. Positive results are indicative of active infection with SARS-CoV-2. Clinical  correlation with patient history and other diagnostic information is necessary to determine patient infection status. Positive results do  not rule out bacterial infection or co-infection with other viruses. The expected result is Negative. Fact Sheet for Patients: SugarRoll.be Fact Sheet for Healthcare Providers: https://www.woods-mathews.com/ This test is not yet approved or cleared by the [place] FDA and  has been authorized for detection and/or diagnosis of SARS-CoV-2 by FDA under an Emergency Use Authorization (EUA). This EUA will remain  in effect (meaning this test can be used) for  the duration of the COVID-19 declaration under Section 564(b)(1) of the Act, 21 U.S.C. section 360bbb-3(b)(1), unless the authorization is terminated or revoked sooner. Performed at [facility], [place] [address], [place], [place] [address]      Labs: BNP (last 3 results) No results for input(s): BNP in the last 8760 hours. Basic Metabolic Panel: Recent Labs  Lab 03/08/19 2047 03/11/19 1422 03/11/19 2326 03/12/19 0212 03/13/19 0422 03/14/19 0430  NA 137 138  --  138 140 139  K 3.0* 3.1*  --  2.9* 3.7 3.1*  CL 103 102  --  111 109 106  CO2 20* 22  --  17* 22 22  GLUCOSE 151* 105*  --  121* 163* 163*  BUN 8 6  --  6 5* 9  CREATININE 1.00 1.03*  --  0.92 0.72 0.83  CALCIUM 9.5 9.1  --  7.9* 8.8* 8.4*  MG  --   --  2.0  --   --   --    Liver Function Tests: Recent Labs  Lab 03/08/19 2047 03/11/19 1422 03/13/19 0422 03/14/19 0430  AST 26 18 11* 12*  ALT 29 23 20 18   ALKPHOS 78 79 77 83  BILITOT 0.8 1.1 0.2* 0.5  PROT 7.3 7.1 6.2* 5.7*  ALBUMIN 3.8 3.5 2.6* 2.5*   No results for input(s): LIPASE, AMYLASE in the last 168 hours. No  results for input(s): AMMONIA in the last 168 hours. CBC: Recent Labs  Lab 03/08/19 2047 03/11/19 1422 03/12/19 0212 03/13/19 0422 03/14/19 0430  WBC 9.1 13.3* 10.5 17.6* 21.4*  NEUTROABS 6.8 10.8*  --  15.2* 18.3*  HGB 13.2 12.9 10.5* 11.2* 10.5*  HCT 39.7 38.9 30.9* 32.1* 30.9*  MCV 83.6 82.4 82.6 80.7 81.1  PLT 314 330 263 333 369   Cardiac Enzymes: No results for input(s): CKTOTAL, CKMB, CKMBINDEX, TROPONINI in the last 168 hours. BNP: Invalid input(s): POCBNP CBG: No results for input(s): GLUCAP in the last 168 hours. D-Dimer Recent Labs    03/13/19 0422 03/14/19 0430  DDIMER 2.01* 0.82*   Hgb A1c No results for input(s): HGBA1C in  the last 72 hours. Lipid Profile No results for input(s): CHOL, HDL, LDLCALC, TRIG, CHOLHDL, LDLDIRECT in the last 72 hours. Thyroid function studies No results for input(s): TSH, T4TOTAL, T3FREE, THYROIDAB in the last 72 hours.  Invalid input(s): FREET3 Anemia work up Recent Labs    03/13/19 0422 03/14/19 0430  FERRITIN 135 104   Urinalysis    Component Value Date/Time   COLORURINE YELLOW 03/11/2019 2126   APPEARANCEUR CLEAR 03/11/2019 2126   LABSPEC 1.008 03/11/2019 2126   PHURINE 5.0 03/11/2019 2126   GLUCOSEU NEGATIVE 03/11/2019 2126   HGBUR NEGATIVE 03/11/2019 2126   BILIRUBINUR NEGATIVE 03/11/2019 2126   KETONESUR 20 (A) 03/11/2019 2126   PROTEINUR NEGATIVE 03/11/2019 2126   NITRITE NEGATIVE 03/11/2019 2126   LEUKOCYTESUR NEGATIVE 03/11/2019 2126   Sepsis Labs Invalid input(s): PROCALCITONIN,  WBC,  LACTICIDVEN Microbiology Recent Results (from the past 240 hour(s))  Blood Culture (routine x 2)     Status: None (Preliminary result)   Collection Time: 03/11/19  6:01 PM   Specimen: BLOOD  Result Value Ref Range Status   Specimen Description BLOOD LEFT ANTECUBITAL  Final   Special Requests   Final    BOTTLES DRAWN AEROBIC AND ANAEROBIC Blood Culture adequate volume   Culture   Final    NO GROWTH 2  DAYS Performed at [facility], [place] [address], [place], [place] [address]    Report Status PENDING  Incomplete  Blood Culture (routine x 2)     Status: None (Preliminary result)   Collection Time: 03/11/19  6:18 PM   Specimen: BLOOD  Result Value Ref Range Status   Specimen Description BLOOD RIGHT ANTECUBITAL  Final   Special Requests   Final    BOTTLES DRAWN AEROBIC AND ANAEROBIC Blood Culture adequate volume   Culture   Final    NO GROWTH 2 DAYS Performed at [facility], [place] [address], [place], [place] [address]    Report Status PENDING  Incomplete  Urine culture     Status: None   Collection Time: 03/11/19  9:26 PM   Specimen: Urine, Clean Catch  Result Value Ref Range Status   Specimen Description URINE, CLEAN CATCH  Final   Special Requests NONE  Final   Culture   Final    NO GROWTH Performed at [facility], [place] [address], [place], [place] [address]    Report Status 03/12/2019 FINAL  Final  Gastrointestinal Panel by PCR , Stool     Status: None   Collection Time: 03/12/19  2:12 AM   Specimen: Stool  Result Value Ref Range Status   Campylobacter species NOT DETECTED NOT DETECTED Final   Plesimonas shigelloides NOT DETECTED NOT DETECTED Final   Salmonella species NOT DETECTED NOT DETECTED Final   Yersinia enterocolitica NOT DETECTED NOT DETECTED Final   Vibrio species NOT DETECTED NOT DETECTED Final   Vibrio cholerae NOT DETECTED NOT DETECTED Final   Enteroaggregative E coli (EAEC) NOT DETECTED NOT DETECTED Final   Enteropathogenic E coli (EPEC) NOT DETECTED NOT DETECTED Final   Enterotoxigenic E coli (ETEC) NOT DETECTED NOT DETECTED Final   Shiga like toxin producing E coli (STEC) NOT DETECTED NOT DETECTED Final   Shigella/Enteroinvasive E coli (EIEC) NOT DETECTED NOT DETECTED Final   Cryptosporidium NOT DETECTED NOT DETECTED Final   Cyclospora cayetanensis NOT DETECTED NOT DETECTED Final   Entamoeba histolytica NOT DETECTED NOT DETECTED Final    Giardia lamblia NOT DETECTED NOT DETECTED Final  Adenovirus F40/41 NOT DETECTED NOT DETECTED Final   Astrovirus NOT DETECTED NOT DETECTED Final   Norovirus GI/GII NOT DETECTED NOT DETECTED Final   Rotavirus A NOT DETECTED NOT DETECTED Final   Sapovirus (I, II, IV, and V) NOT DETECTED NOT DETECTED Final    Comment: Performed at [facility], [place], [place] [address]  C Difficile Quick Screen w PCR reflex     Status: None   Collection Time: 03/12/19  2:12 AM   Specimen: Stool  Result Value Ref Range Status   C Diff antigen NEGATIVE NEGATIVE Final   C Diff toxin NEGATIVE NEGATIVE Final   C Diff interpretation No C. difficile detected.  Final    Comment: Performed at [facility], [place] [address], [place], [place] [address]  SARS CORONAVIRUS 2 (TAT 6-24 HRS) Nasopharyngeal Nasopharyngeal Swab     Status: Abnormal   Collection Time: 03/12/19  8:01 AM   Specimen: Nasopharyngeal Swab  Result Value Ref Range Status   SARS Coronavirus 2 POSITIVE (A) NEGATIVE Final    Comment: RESULT CALLED TO, READ BACK BY AND VERIFIED WITH: Precious Reel RN 14:30 03/12/19 ([email or web address]) (NOTE) SARS-CoV-2 target nucleic acids are DETECTED. The SARS-CoV-2 RNA is generally detectable in upper and lower respiratory specimens during the acute phase of infection. Positive results are indicative of active infection with SARS-CoV-2. Clinical  correlation with patient history and other diagnostic information is necessary to determine patient infection status. Positive results do  not rule out bacterial infection or co-infection with other viruses. The expected result is Negative. Fact Sheet for Patients: SugarRoll.be Fact Sheet for Healthcare Providers: https://www.woods-mathews.com/ This test is not yet approved or cleared by the [place] FDA and  has been authorized for detection and/or diagnosis of SARS-CoV-2 by FDA under an Emergency Use  Authorization (EUA). This EUA will remain  in effect (meaning this test can be used) for  the duration of the COVID-19 declaration under Section 564(b)(1) of the Act, 21 U.S.C. section 360bbb-3(b)(1), unless the authorization is terminated or revoked sooner. Performed at [facility], [place] [address], [place],  [address]    Time spent: 30 min  SIGNED:   Marylu Lund, MD  [facility] 03/14/2019, 11:14 AM  If 7PM-7AM, please contact night-coverage

## 2019-03-16 LAB — CULTURE, BLOOD (ROUTINE X 2)
Culture: NO GROWTH
Culture: NO GROWTH
Special Requests: ADEQUATE
Special Requests: ADEQUATE

## 2019-03-18 ENCOUNTER — Ambulatory Visit (HOSPITAL_COMMUNITY)
Admission: RE | Admit: 2019-03-18 | Discharge: 2019-03-18 | Disposition: A | Discharge status: Home / Self Care | Payer: 59 | Source: Ambulatory Visit | Attending: Gastroenterology | Admitting: Gastroenterology

## 2019-03-18 ENCOUNTER — Other Ambulatory Visit: Payer: Self-pay

## 2019-03-18 DIAGNOSIS — Z8719 Personal history of other diseases of the digestive system: Secondary | ICD-10-CM

## 2019-03-18 DIAGNOSIS — R109 Unspecified abdominal pain: Secondary | ICD-10-CM | POA: Diagnosis present

## 2019-03-18 DIAGNOSIS — R197 Diarrhea, unspecified: Secondary | ICD-10-CM | POA: Insufficient documentation

## 2019-03-18 MED ORDER — IOHEXOL 300 MG/ML  SOLN
100.0000 mL | Freq: Once | INTRAMUSCULAR | Status: AC | PRN
  Administered 2019-03-18: 10:00:00 100 mL via INTRAVENOUS

## 2019-04-10 ENCOUNTER — Ambulatory Visit
Admission: RE | Admit: 2019-04-10 | Discharge: 2019-04-10 | Disposition: A | Discharge status: Home / Self Care | Payer: 59 | Source: Ambulatory Visit | Attending: Family Medicine | Admitting: Family Medicine

## 2019-04-10 ENCOUNTER — Other Ambulatory Visit: Payer: Self-pay

## 2019-04-10 DIAGNOSIS — Z1231 Encounter for screening mammogram for malignant neoplasm of breast: Secondary | ICD-10-CM

## 2019-04-15 ENCOUNTER — Other Ambulatory Visit: Payer: Self-pay | Admitting: Gastroenterology

## 2019-04-15 DIAGNOSIS — N949 Unspecified condition associated with female genital organs and menstrual cycle: Secondary | ICD-10-CM

## 2019-04-24 ENCOUNTER — Other Ambulatory Visit: Payer: 59

## 2019-05-12 ENCOUNTER — Other Ambulatory Visit: Payer: 59

## 2019-06-20 ENCOUNTER — Ambulatory Visit: Payer: 59 | Attending: Internal Medicine

## 2019-06-20 DIAGNOSIS — Z23 Encounter for immunization: Secondary | ICD-10-CM

## 2019-06-20 NOTE — Progress Notes (Signed)
   Covid-19 Vaccination Clinic  Name:  Annette Thomas    MRN: [id number] DOB: 07/27/1970  06/20/2019  Ms. [name] was observed post Covid-19 immunization for 15 minutes without incidence. She was provided with Vaccine Information Sheet and instruction to access the V-Safe system.   Ms. [name] was instructed to call 911 with any severe reactions post vaccine: Marland Kitchen Difficulty breathing  . Swelling of your face and throat  . A fast heartbeat  . A bad rash all over your body  . Dizziness and weakness    Immunizations Administered    Name Date Dose VIS Date Route   Pfizer COVID-19 Vaccine 06/20/2019  2:56 PM 0.3 mL 04/18/2019 Intramuscular   Manufacturer: [place]   Lot: [id number]   [place]: [id number]

## 2019-07-13 ENCOUNTER — Ambulatory Visit: Payer: 59 | Attending: Internal Medicine

## 2019-07-13 DIAGNOSIS — Z23 Encounter for immunization: Secondary | ICD-10-CM | POA: Insufficient documentation

## 2019-07-13 NOTE — Progress Notes (Signed)
   Covid-19 Vaccination Clinic  Name:  Annette Thomas    MRN: [id number] DOB: December 12, 1970  07/13/2019  Ms. Annette Thomas was observed post Covid-19 immunization for 15 minutes without incident. She was provided with Vaccine Information Sheet and instruction to access the V-Safe system.   Ms. Annette Thomas was instructed to call 911 with any severe reactions post vaccine: Marland Kitchen Difficulty breathing  . Swelling of face and throat  . A fast heartbeat  . A bad rash all over body  . Dizziness and weakness   Immunizations Administered    Name Date Dose VIS Date Route   Pfizer COVID-19 Vaccine 07/13/2019 11:03 AM 0.3 mL 04/18/2019 Intramuscular   Manufacturer: [place]   Lot: [id number]   [place]: [id number]

## 2020-06-01 IMAGING — MG DIGITAL SCREENING BILAT W/ TOMO W/ CAD
8 series · 8 of 24 positions shown · non-contrast
Comparison: Previous exam(s).

CLINICAL DATA: Screening.

EXAM:
DIGITAL SCREENING BILATERAL MAMMOGRAM WITH TOMO AND CAD

[L CC synth-2D]
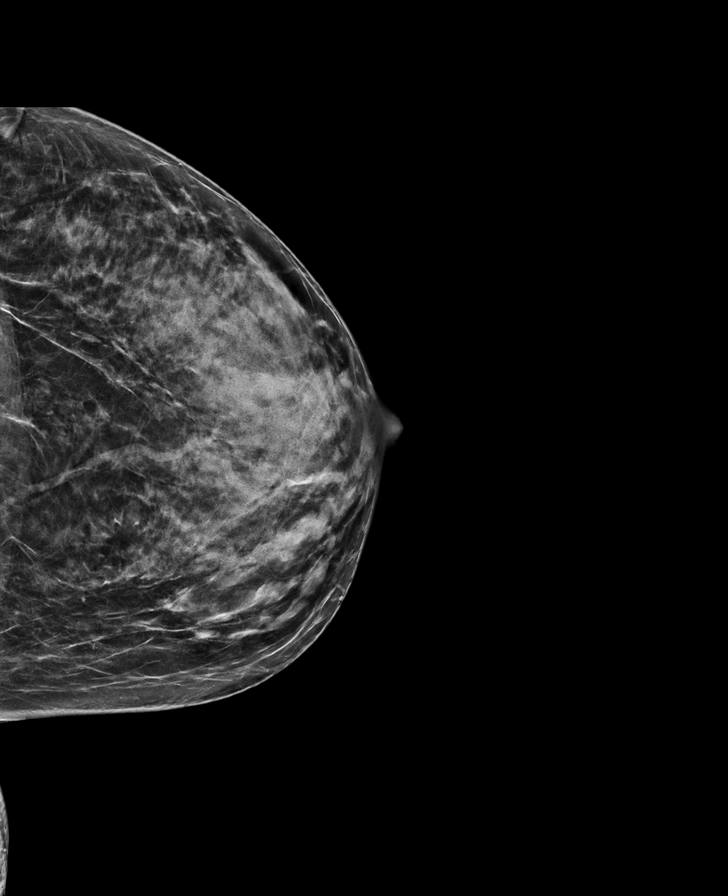

[R CC synth-2D]
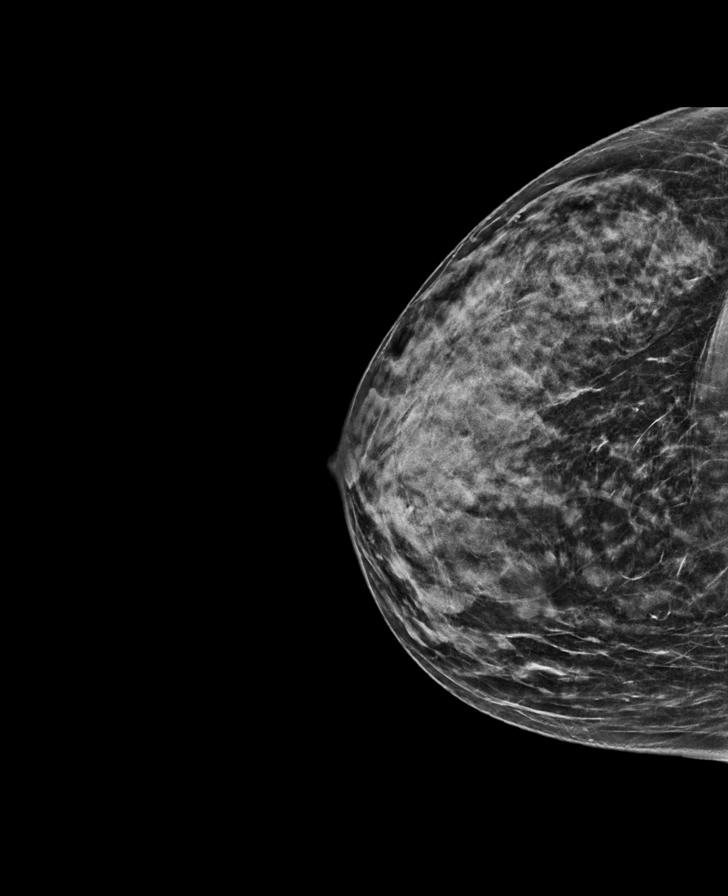

[R MLO synth-2D]
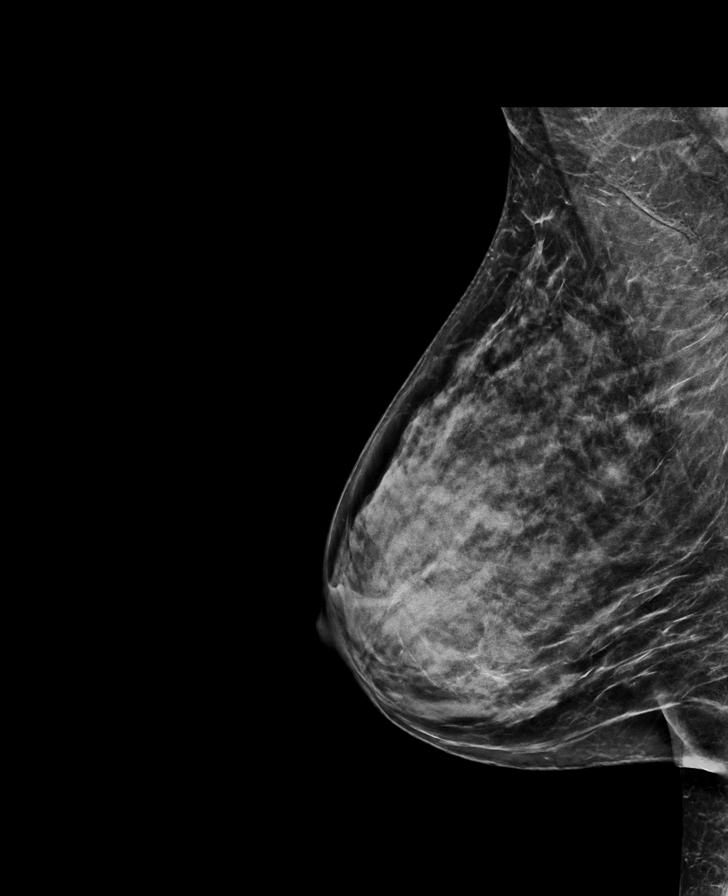

[L MLO synth-2D]
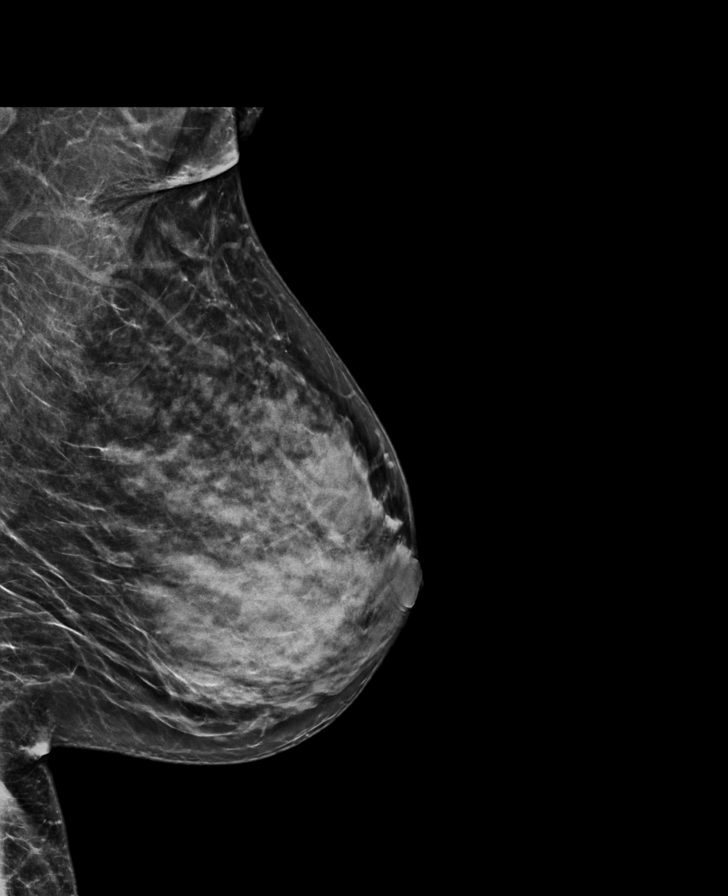

[L MLO tomo · tomo slice 31/62.0]
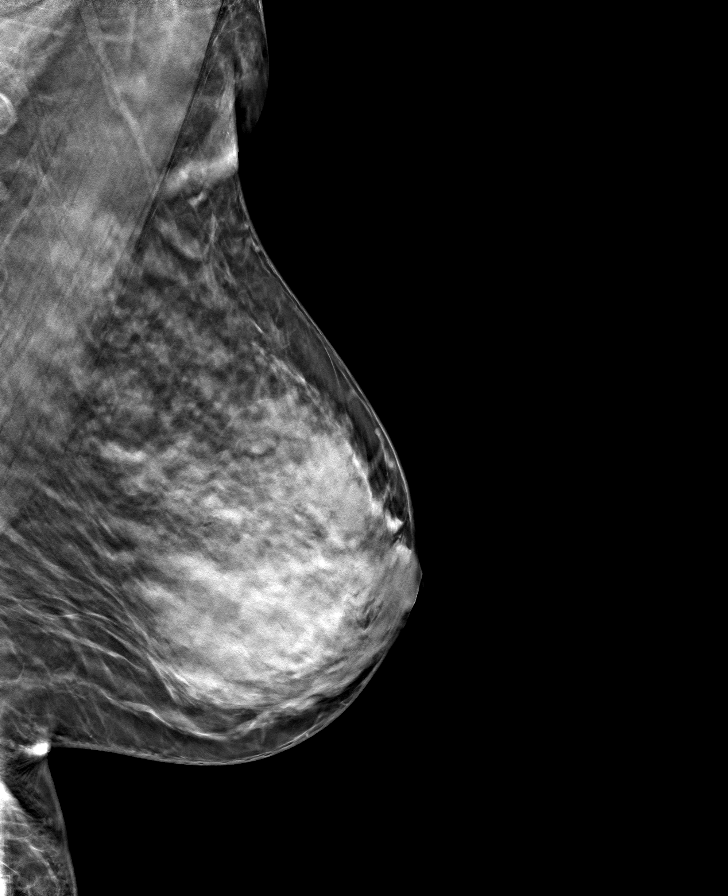

[R MLO tomo · tomo slice 33/64.0]
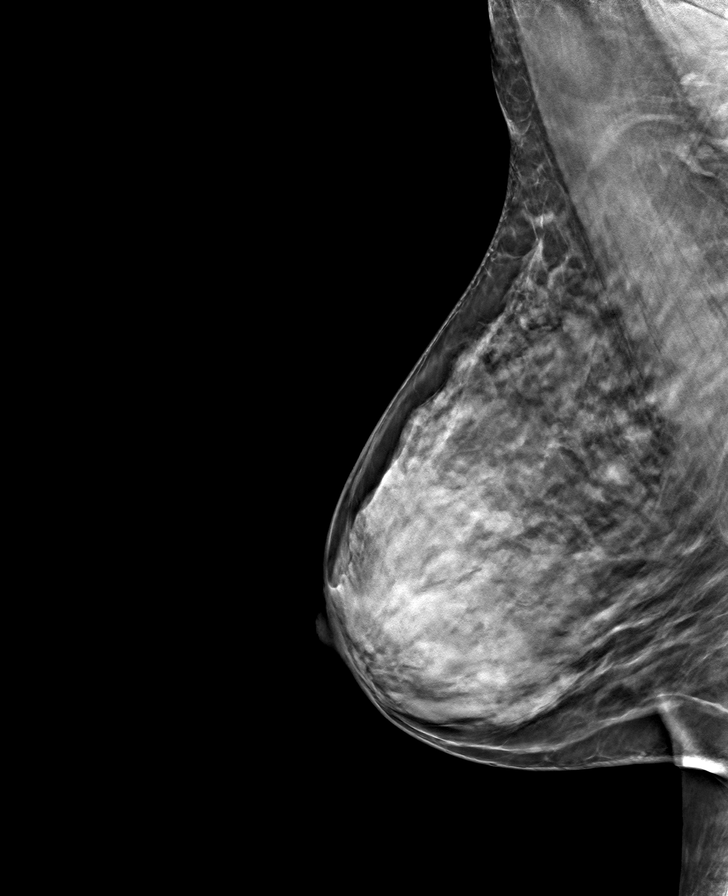

[L CC tomo · tomo slice 29/58.0]
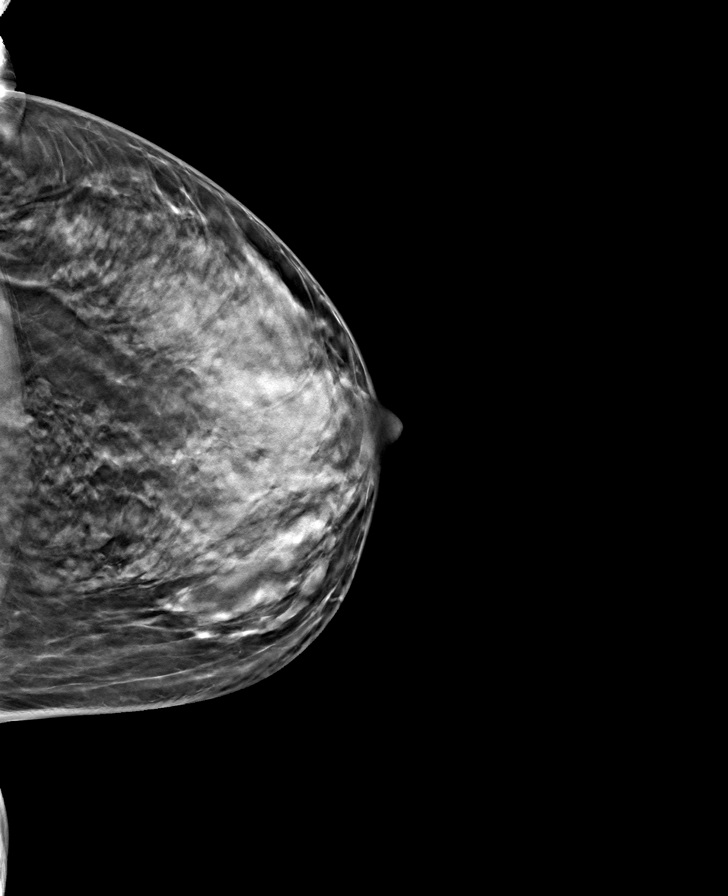

[R CC tomo · tomo slice 27/54.0]
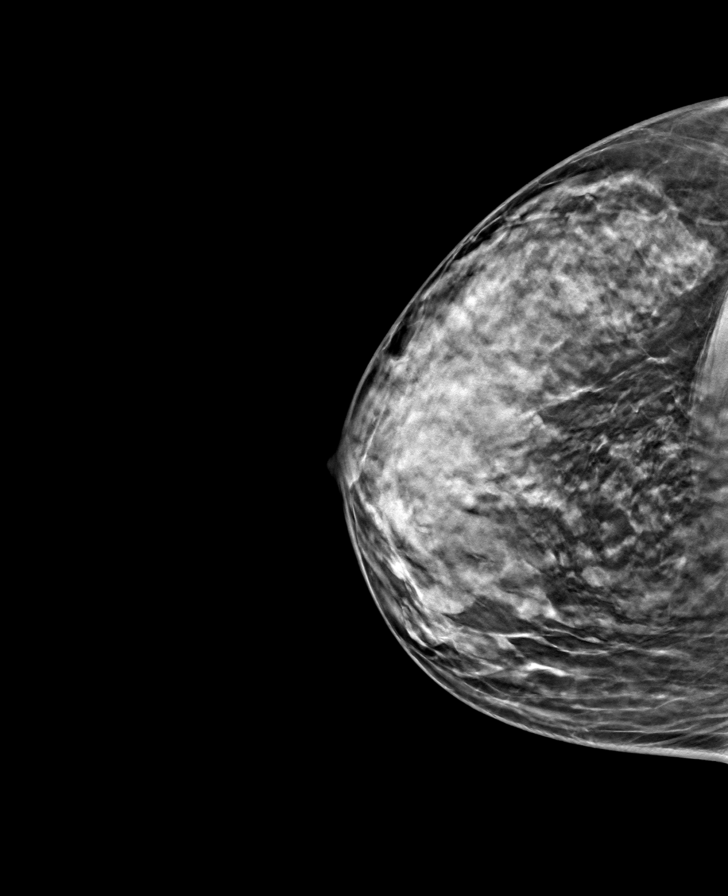

[8 of 24 positions shown; findings below may reference images not displayed]

ACR Breast Density Category d: The breast tissue is extremely dense,
which lowers the sensitivity of mammography
FINDINGS: There are no findings suspicious for malignancy. Images were
processed with CAD.
IMPRESSION: No mammographic evidence of malignancy. A result letter of this
screening mammogram will be mailed directly to the patient.

RECOMMENDATION:
Screening mammogram in one year. (Code:[id number]-[id number])

BI-RADS CATEGORY  1: Negative.

## 2020-07-13 ENCOUNTER — Other Ambulatory Visit: Payer: Self-pay | Admitting: Family Medicine

## 2020-07-13 DIAGNOSIS — Z1231 Encounter for screening mammogram for malignant neoplasm of breast: Secondary | ICD-10-CM

## 2020-08-03 ENCOUNTER — Other Ambulatory Visit: Payer: Self-pay

## 2020-08-03 ENCOUNTER — Ambulatory Visit
Admission: RE | Admit: 2020-08-03 | Discharge: 2020-08-03 | Disposition: A | Discharge status: Home / Self Care | Payer: No Typology Code available for payment source | Source: Ambulatory Visit | Attending: Family Medicine | Admitting: Family Medicine

## 2020-08-03 DIAGNOSIS — Z1231 Encounter for screening mammogram for malignant neoplasm of breast: Secondary | ICD-10-CM

## 2020-08-05 ENCOUNTER — Other Ambulatory Visit (HOSPITAL_COMMUNITY)
Admission: RE | Admit: 2020-08-05 | Discharge: 2020-08-05 | Disposition: A | Discharge status: Home / Self Care | Payer: No Typology Code available for payment source | Source: Ambulatory Visit | Attending: Family Medicine | Admitting: Family Medicine

## 2020-08-05 DIAGNOSIS — Z124 Encounter for screening for malignant neoplasm of cervix: Secondary | ICD-10-CM | POA: Diagnosis present

## 2020-08-06 LAB — CYTOLOGY - PAP
Comment: NEGATIVE
Diagnosis: NEGATIVE
High risk HPV: NEGATIVE

## 2020-09-03 ENCOUNTER — Ambulatory Visit: Payer: 59

## 2021-05-31 ENCOUNTER — Encounter (HOSPITAL_COMMUNITY): Payer: Self-pay

## 2021-05-31 ENCOUNTER — Ambulatory Visit (HOSPITAL_COMMUNITY)
Admission: EM | Admit: 2021-05-31 | Discharge: 2021-05-31 | Disposition: A | Discharge status: Home / Self Care | Payer: No Typology Code available for payment source | Attending: Emergency Medicine | Admitting: Emergency Medicine

## 2021-05-31 ENCOUNTER — Other Ambulatory Visit: Payer: Self-pay

## 2021-05-31 DIAGNOSIS — N939 Abnormal uterine and vaginal bleeding, unspecified: Secondary | ICD-10-CM | POA: Diagnosis not present

## 2021-05-31 DIAGNOSIS — R102 Pelvic and perineal pain: Secondary | ICD-10-CM | POA: Insufficient documentation

## 2021-05-31 DIAGNOSIS — D259 Leiomyoma of uterus, unspecified: Secondary | ICD-10-CM | POA: Diagnosis not present

## 2021-05-31 LAB — POCT URINALYSIS DIPSTICK, ED / UC
Bilirubin Urine: NEGATIVE
Glucose, UA: NEGATIVE mg/dL
Leukocytes,Ua: NEGATIVE
Nitrite: NEGATIVE
Protein, ur: 100 mg/dL — AB
Specific Gravity, Urine: 1.01 (ref 1.005–1.030)
Urobilinogen, UA: 0.2 mg/dL (ref 0.0–1.0)
pH: 6 (ref 5.0–8.0)

## 2021-05-31 MED ORDER — KETOROLAC TROMETHAMINE 30 MG/ML IJ SOLN
INTRAMUSCULAR | Status: AC
  Filled 2021-05-31: qty 1

## 2021-05-31 MED ORDER — ACETAMINOPHEN 325 MG PO TABS
975.0000 mg | ORAL_TABLET | Freq: Once | ORAL | Status: AC
  Administered 2021-05-31: 09:00:00 975 mg via ORAL

## 2021-05-31 MED ORDER — KETOROLAC TROMETHAMINE 30 MG/ML IJ SOLN
30.0000 mg | Freq: Once | INTRAMUSCULAR | Status: AC
  Administered 2021-05-31: 09:00:00 30 mg via INTRAMUSCULAR

## 2021-05-31 MED ORDER — ACETAMINOPHEN 325 MG PO TABS
ORAL_TABLET | ORAL | Status: AC
  Filled 2021-05-31: qty 3

## 2021-05-31 MED ORDER — IBUPROFEN 600 MG PO TABS: 600.0000 mg | ORAL_TABLET | Freq: Four times a day (QID) | ORAL | 0 refills | Status: AC | PRN

## 2021-05-31 NOTE — ED Triage Notes (Signed)
Pt reports lower abdominal pain since 1/12023. States she had a heavy bleeding after 5 months and then spotted for 2 days and had abdominal bloating. Pt reports she was spotting yesterday again. Pt reports she was diagnosed with diverticulitis on 05/12/21 and was prescribed Flagyl and Ciprofloxacin

## 2021-05-31 NOTE — Discharge Instructions (Addendum)
Take 600 mg of ibuprofen, 1000 mg of Tylenol 3-4 times a day as needed for pain.  Go immediately to the ED if your pain changes, gets worse, is not controlled with the Tylenol/ibuprofen, fevers above 100.4, if you are bleeding through more than 1 pad per hour, or for any other concerns

## 2021-05-31 NOTE — ED Provider Notes (Signed)
HPI  SUBJECTIVE:  Annette Thomas is a 51 y.o. female who presents with nonmigratory low midline pelvic pain that radiates into her back that started last night.  Describes it as gradually building up pain/pressure that was severe last night.  She reports vaginal bleeding starting this morning.  She has not yet gone through a pad.  She tried hydrocodone last night, heat and Aleve which helped.  Urination also improves her symptoms.  Symptoms are worse with sitting forward.  She reports urinary frequency, nausea, bloating.  No other urinary complaints, vaginal odor, discharge. She had a normal bowel movement yesterday.  She reports some anorexia.  She is married to her husband, who is asymptomatic, STDs are not a concern today.  She has not been sexually active in "months".  Walking, car ride was not painful.  The pain is not associated with p.o. intake.  She is unsure if this could be fibroids, ovarian cysts, or flare of her diverticulitis. Patient has a past medical history of hypertension, diverticulitis, fibroids, ulcerative colitis, right ovarian cyst, status postcholecystectomy.  Recently treated for diverticulitis on 1/5 by her PCP with improvement in her symptoms.  No history of UTI, pyelonephritis, nephrolithiasis, PCOS, torsion, PID, STDs, BV, yeast.  PMD: [facility].  History reviewed. No pertinent past medical history.  Past Surgical History:  Procedure Laterality Date   GALLBLADDER SURGERY     IR RADIOLOGIST EVAL & MGMT  03/28/2017    Family History  Problem Relation Age of Onset   Heart attack Paternal Grandfather    Brain cancer Paternal Grandmother        tumor   Diabetes Maternal Grandmother    Heart attack Maternal Grandfather    Diabetes Maternal Grandfather    Heart attack Father    Diabetes Mother     Social History   Tobacco Use   Smoking status: Never   Smokeless tobacco: Never  Substance Use Topics   Alcohol use: Yes    Comment: social   Drug use:  No     Current Facility-Administered Medications:    acetaminophen (TYLENOL) tablet 975 mg, 975 mg, Oral, Once, Melynda Ripple, MD   ketorolac (TORADOL) 30 MG/ML injection 30 mg, 30 mg, Intramuscular, Once, Melynda Ripple, MD  Current Outpatient Medications:    ibuprofen (ADVIL) 600 MG tablet, Take 1 tablet (600 mg total) by mouth every 6 (six) hours as needed., Disp: 30 tablet, Rfl: 0   amLODipine (NORVASC) 5 MG tablet, Take 5 mg by mouth daily., Disp: , Rfl:    Cetirizine HCl 10 MG CAPS, Take 1 capsule (10 mg total) by mouth daily for 10 days., Disp: 10 capsule, Rfl: 0   fluticasone (FLONASE) 50 MCG/ACT nasal spray, Place 1-2 sprays into both nostrils daily for 7 days., Disp: 1 g, Rfl: 0   mesalamine (LIALDA) 1.2 g EC tablet, Take 2.4 g by mouth daily. , Disp: , Rfl:    Multiple Vitamin (MULTIVITAMIN) tablet, Take 1 tablet by mouth daily., Disp: , Rfl:    vitamin C (ASCORBIC ACID) 250 MG tablet, Take 1 tablet (250 mg total) by mouth daily., Disp: 14 tablet, Rfl: 0   VITAMIN D, ERGOCALCIFEROL, PO, Take 5,000 Units by mouth daily., Disp: , Rfl:    Zinc Sulfate 220 (50 Zn) MG TABS, Take 1 tablet (220 mg total) by mouth daily., Disp: 14 tablet, Rfl: 0  No Known Allergies   ROS  As noted in HPI.   Physical Exam  BP (!) 146/79 (BP Location:  Left Arm)    Pulse 81    Temp 98.1 F (36.7 C) (Oral)    Resp 16    LMP 05/08/2021 (Exact Date)    SpO2 99%   Constitutional: Well developed, well nourished, no acute distress Eyes:  EOMI, conjunctiva normal bilaterally HENT: Normocephalic, atraumatic,mucus membranes moist Respiratory: Normal inspiratory effort Cardiovascular: Normal rate GI: Normal appearance.  Nondistended soft, nontender.  Diffuse tenderness, maximal in the mid suprapubic, left lower quadrant region.  back: Questionable left CVA tenderness GU: External genitalia normal.  Normal vaginal mucosa.  Normal os.  Positive bleeding from os.  Positive tender irregular uterus.  No  adnexal tenderness.  No CMT.  No adnexal masses.  Chaperone present during exam skin: No rash, skin intact Musculoskeletal: no deformities Neurologic: Alert & oriented x 3, no focal neuro deficits Psychiatric: Speech and behavior appropriate   ED Course   Medications  acetaminophen (TYLENOL) tablet 975 mg (has no administration in time range)  ketorolac (TORADOL) 30 MG/ML injection 30 mg (has no administration in time range)    Orders Placed This Encounter  Procedures   Pelvic exam    Standing Status:   Standing    Number of Occurrences:   1   POCT Urinalysis Dipstick (ED/UC)    Standing Status:   Standing    Number of Occurrences:   1    Results for orders placed or performed during the hospital encounter of 05/31/21 (from the past 24 hour(s))  POCT Urinalysis Dipstick (ED/UC)     Status: Abnormal   Collection Time: 05/31/21  8:31 AM  Result Value Ref Range   Glucose, UA NEGATIVE NEGATIVE mg/dL   Bilirubin Urine NEGATIVE NEGATIVE   Ketones, ur TRACE (A) NEGATIVE mg/dL   Specific Gravity, Urine 1.010 1.005 - 1.030   Hgb urine dipstick LARGE (A) NEGATIVE   pH 6.0 5.0 - 8.0   Protein, ur 100 (A) NEGATIVE mg/dL   Urobilinogen, UA 0.2 0.0 - 1.0 mg/dL   Nitrite NEGATIVE NEGATIVE   Leukocytes,Ua NEGATIVE NEGATIVE   No results found.  ED Clinical Impression  1. Pelvic pain   2. Vaginal bleeding   3. Uterine leiomyoma, unspecified location     ED Assessment/Plan  Previous records reviewed.  As noted in HPI.  Patient with large hematuria, trace ketones and proteinuria, which I suspect is from vaginal bleeding.  No nitrites, leukocytes Patient declined STD testing, and since she is in a long-term monogamous relationship with her husband, I agree that she is low risk, so testing was not done.  We will check swab for BV as this can cause pelvic pain.  Giving Toradol 30 mg IM, Tylenol 975 mg p.o. x1.  Suspect degenerating fibroids causing her pain.  There is no evidence of  a surgical abdomen.  It does not appear to be a UTI, pyelonephritis, nephrolithiasis, diverticulitis, TOA, ovarian torsion, ruptured ovarian cyst.  Sending home with Tylenol/ibuprofen, strict ER return precautions given the patient.  She will follow-up with her PMD as needed  pt provided working phone number. Follow-up with PMD as needed. Discussed labs, MDM, plan and followup with patient. Pt agrees with plan.   Meds ordered this encounter  Medications   acetaminophen (TYLENOL) tablet 975 mg   ketorolac (TORADOL) 30 MG/ML injection 30 mg   ibuprofen (ADVIL) 600 MG tablet    Sig: Take 1 tablet (600 mg total) by mouth every 6 (six) hours as needed.    Dispense:  30 tablet    Refill:  0    *This clinic note was created using [employer]. Therefore, there may be occasional mistakes despite careful proofreading.  ?     Melynda Ripple, MD 05/31/21 1422

## 2021-06-01 LAB — CERVICOVAGINAL ANCILLARY ONLY
Bacterial Vaginitis (gardnerella): NEGATIVE
Comment: NEGATIVE

## 2021-06-07 ENCOUNTER — Other Ambulatory Visit: Payer: Self-pay | Admitting: Family Medicine

## 2021-06-07 DIAGNOSIS — R102 Pelvic and perineal pain: Secondary | ICD-10-CM

## 2021-06-10 ENCOUNTER — Ambulatory Visit
Admission: RE | Admit: 2021-06-10 | Discharge: 2021-06-10 | Disposition: A | Discharge status: Home / Self Care | Payer: No Typology Code available for payment source | Source: Ambulatory Visit | Attending: Family Medicine | Admitting: Family Medicine

## 2021-06-10 DIAGNOSIS — R102 Pelvic and perineal pain: Secondary | ICD-10-CM

## 2021-07-04 ENCOUNTER — Other Ambulatory Visit: Payer: Self-pay | Admitting: Family Medicine

## 2021-07-05 ENCOUNTER — Other Ambulatory Visit: Payer: Self-pay | Admitting: Family Medicine

## 2021-07-05 DIAGNOSIS — Z1231 Encounter for screening mammogram for malignant neoplasm of breast: Secondary | ICD-10-CM

## 2021-07-12 ENCOUNTER — Other Ambulatory Visit: Payer: Self-pay

## 2021-07-12 ENCOUNTER — Other Ambulatory Visit: Payer: Self-pay | Admitting: Family Medicine

## 2021-07-12 DIAGNOSIS — N644 Mastodynia: Secondary | ICD-10-CM

## 2021-08-08 ENCOUNTER — Ambulatory Visit
Admission: RE | Admit: 2021-08-08 | Discharge: 2021-08-08 | Disposition: A | Discharge status: Home / Self Care | Payer: No Typology Code available for payment source | Source: Ambulatory Visit | Attending: Family Medicine | Admitting: Family Medicine

## 2021-08-08 ENCOUNTER — Ambulatory Visit: Payer: No Typology Code available for payment source

## 2021-08-08 DIAGNOSIS — N644 Mastodynia: Secondary | ICD-10-CM

## 2021-10-20 ENCOUNTER — Ambulatory Visit
Admission: RE | Admit: 2021-10-20 | Discharge: 2021-10-20 | Disposition: A | Discharge status: Home / Self Care | Payer: No Typology Code available for payment source | Source: Ambulatory Visit | Attending: Family Medicine | Admitting: Family Medicine

## 2021-10-20 ENCOUNTER — Other Ambulatory Visit: Payer: Self-pay | Admitting: Family Medicine

## 2021-10-20 DIAGNOSIS — R0789 Other chest pain: Secondary | ICD-10-CM

## 2021-10-24 ENCOUNTER — Ambulatory Visit: Payer: No Typology Code available for payment source | Admitting: Plastic Surgery

## 2021-10-24 VITALS — BP 162/95 | HR 72 | Ht 67.0 in | Wt 165.8 lb

## 2021-10-24 DIAGNOSIS — R220 Localized swelling, mass and lump, head: Secondary | ICD-10-CM | POA: Diagnosis not present

## 2021-10-24 DIAGNOSIS — D489 Neoplasm of uncertain behavior, unspecified: Secondary | ICD-10-CM

## 2021-10-24 NOTE — Progress Notes (Signed)
Referring Provider Harlan Stains, MD [address],  [place] [id number]   CC:  Left forehead mass   Annette Thomas is an 51 y.o. female.  HPI: Patient is a 51 year old who hit her head several years ago and has noted a mass in her left forehead.  She did have x-ray 4 years ago but has not had recent imaging.    No Known Allergies  Outpatient Encounter Medications as of 10/24/2021  Medication Sig   amLODipine (NORVASC) 5 MG tablet Take 5 mg by mouth daily.   ibuprofen (ADVIL) 600 MG tablet Take 1 tablet (600 mg total) by mouth every 6 (six) hours as needed.   Multiple Vitamin (MULTIVITAMIN) tablet Take 1 tablet by mouth daily.   vitamin C (ASCORBIC ACID) 250 MG tablet Take 1 tablet (250 mg total) by mouth daily.   VITAMIN D, ERGOCALCIFEROL, PO Take 5,000 Units by mouth daily.   Zinc Sulfate 220 (50 Zn) MG TABS Take 1 tablet (220 mg total) by mouth daily.   Cetirizine HCl 10 MG CAPS Take 1 capsule (10 mg total) by mouth daily for 10 days.   fluticasone (FLONASE) 50 MCG/ACT nasal spray Place 1-2 sprays into both nostrils daily for 7 days.   [DISCONTINUED] hydrochlorothiazide (MICROZIDE) 12.5 MG capsule Take 1 capsule (12.5 mg total) by mouth daily. (Patient not taking: Reported on 10/04/2015)   [DISCONTINUED] magic mouthwash SOLN 1: 1 benadryl to maalox   [DISCONTINUED] mesalamine (LIALDA) 1.2 g EC tablet Take 2.4 g by mouth daily.    No facility-administered encounter medications on file as of 10/24/2021.     No past medical history on file.  Past Surgical History:  Procedure Laterality Date   GALLBLADDER SURGERY     IR RADIOLOGIST EVAL & MGMT  03/28/2017    Family History  Problem Relation Age of Onset   Heart attack Paternal Grandfather    Brain cancer Paternal Grandmother        tumor   Diabetes Maternal Grandmother    Heart attack Maternal Grandfather    Diabetes Maternal Grandfather    Heart attack Father    Diabetes Mother      Social History   Social History Narrative   Not on file     Review of Systems General: Denies fevers, chills, weight loss CV: Denies chest pain, shortness of breath, palpitations   Physical Exam    10/24/2021   10:59 AM 05/31/2021    8:28 AM 03/14/2019    7:49 AM  Vitals with BMI  Height 5' 7"     Weight 165 lbs 13 oz    BMI 20.35    Systolic 597 416 384  Diastolic 95 79 70  Pulse 72 81 71    General:  No acute distress,  Alert and oriented, Non-Toxic, Normal speech and affect HEENT: 1.4 x 1.5 cm left forehead mass.  The mass appears to be bony and affixed to the calvarium.  It is nonmobile.  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  I would like to order head CT to confirm benign appearing mass and that it is only associated with the outer table.  The patient right now does not want to proceed with imaging.  I explained that there is a small very small possibility this could malignancy so I would like to image her and then remove th lesion.  She is going to consider this and let us know if she is ready  to proceed with imaging.    Lennice Sites 10/24/2021, 10:11 PM

## 2022-08-11 ENCOUNTER — Ambulatory Visit
Admission: RE | Admit: 2022-08-11 | Discharge: 2022-08-11 | Disposition: A | Discharge status: Home / Self Care | Payer: Managed Care, Other (non HMO) | Source: Ambulatory Visit | Attending: Family Medicine | Admitting: Family Medicine

## 2022-08-11 ENCOUNTER — Other Ambulatory Visit: Payer: Self-pay | Admitting: Family Medicine

## 2022-08-11 DIAGNOSIS — Z1231 Encounter for screening mammogram for malignant neoplasm of breast: Secondary | ICD-10-CM

## 2023-01-01 DIAGNOSIS — K529 Noninfective gastroenteritis and colitis, unspecified: Secondary | ICD-10-CM | POA: Diagnosis not present

## 2023-01-31 DIAGNOSIS — Z20828 Contact with and (suspected) exposure to other viral communicable diseases: Secondary | ICD-10-CM | POA: Diagnosis not present

## 2023-01-31 DIAGNOSIS — J101 Influenza due to other identified influenza virus with other respiratory manifestations: Secondary | ICD-10-CM | POA: Diagnosis not present

## 2023-01-31 DIAGNOSIS — R051 Acute cough: Secondary | ICD-10-CM | POA: Diagnosis not present

## 2023-01-31 DIAGNOSIS — Z03818 Encounter for observation for suspected exposure to other biological agents ruled out: Secondary | ICD-10-CM | POA: Diagnosis not present

## 2023-02-09 ENCOUNTER — Other Ambulatory Visit: Payer: Self-pay | Admitting: Orthopedic Surgery

## 2023-02-19 ENCOUNTER — Other Ambulatory Visit: Payer: Self-pay

## 2023-02-19 ENCOUNTER — Emergency Department (HOSPITAL_COMMUNITY): Payer: BC Managed Care – PPO

## 2023-02-19 ENCOUNTER — Encounter (HOSPITAL_COMMUNITY): Payer: Self-pay

## 2023-02-19 ENCOUNTER — Emergency Department (HOSPITAL_COMMUNITY)
Admission: EM | Admit: 2023-02-19 | Discharge: 2023-02-19 | Disposition: A | Discharge status: Home / Self Care | Payer: BC Managed Care – PPO | Attending: Emergency Medicine | Admitting: Emergency Medicine

## 2023-02-19 DIAGNOSIS — I10 Essential (primary) hypertension: Secondary | ICD-10-CM | POA: Insufficient documentation

## 2023-02-19 DIAGNOSIS — K529 Noninfective gastroenteritis and colitis, unspecified: Secondary | ICD-10-CM | POA: Diagnosis not present

## 2023-02-19 DIAGNOSIS — R519 Headache, unspecified: Secondary | ICD-10-CM | POA: Diagnosis not present

## 2023-02-19 DIAGNOSIS — E876 Hypokalemia: Secondary | ICD-10-CM | POA: Diagnosis not present

## 2023-02-19 DIAGNOSIS — Z79899 Other long term (current) drug therapy: Secondary | ICD-10-CM | POA: Insufficient documentation

## 2023-02-19 DIAGNOSIS — D259 Leiomyoma of uterus, unspecified: Secondary | ICD-10-CM | POA: Diagnosis not present

## 2023-02-19 DIAGNOSIS — R1030 Lower abdominal pain, unspecified: Secondary | ICD-10-CM | POA: Diagnosis not present

## 2023-02-19 DIAGNOSIS — K5730 Diverticulosis of large intestine without perforation or abscess without bleeding: Secondary | ICD-10-CM | POA: Diagnosis not present

## 2023-02-19 DIAGNOSIS — K921 Melena: Secondary | ICD-10-CM | POA: Diagnosis not present

## 2023-02-19 DIAGNOSIS — K5190 Ulcerative colitis, unspecified, without complications: Secondary | ICD-10-CM | POA: Diagnosis not present

## 2023-02-19 LAB — URINALYSIS, ROUTINE W REFLEX MICROSCOPIC
Bilirubin Urine: NEGATIVE
Glucose, UA: NEGATIVE mg/dL
Hgb urine dipstick: NEGATIVE
Ketones, ur: NEGATIVE mg/dL
Leukocytes,Ua: NEGATIVE
Nitrite: NEGATIVE
Protein, ur: NEGATIVE mg/dL
Specific Gravity, Urine: 1.012 (ref 1.005–1.030)
pH: 6 (ref 5.0–8.0)

## 2023-02-19 LAB — COMPREHENSIVE METABOLIC PANEL
ALT: 19 U/L (ref 0–44)
AST: 17 U/L (ref 15–41)
Albumin: 3.3 g/dL — ABNORMAL LOW (ref 3.5–5.0)
Alkaline Phosphatase: 84 U/L (ref 38–126)
Anion gap: 14 (ref 5–15)
BUN: 7 mg/dL (ref 6–20)
CO2: 22 mmol/L (ref 22–32)
Calcium: 9.2 mg/dL (ref 8.9–10.3)
Chloride: 105 mmol/L (ref 98–111)
Creatinine, Ser: 1.09 mg/dL — ABNORMAL HIGH (ref 0.44–1.00)
GFR, Estimated: >60 mL/min (ref >60)
Glucose, Bld: 169 mg/dL — ABNORMAL HIGH (ref 70–99)
Potassium: 3.1 mmol/L — ABNORMAL LOW (ref 3.5–5.1)
Sodium: 141 mmol/L (ref 135–145)
Total Bilirubin: 0.7 mg/dL (ref 0.3–1.2)
Total Protein: 7 g/dL (ref 6.5–8.1)

## 2023-02-19 LAB — CBC
HCT: 36.9 % (ref 36.0–46.0)
Hemoglobin: 12.5 g/dL (ref 12.0–15.0)
MCH: 27.8 pg (ref 26.0–34.0)
MCHC: 33.9 g/dL (ref 30.0–36.0)
MCV: 82.2 fL (ref 80.0–100.0)
Platelets: 319 10*3/uL (ref 150–400)
RBC: 4.49 MIL/uL (ref 3.87–5.11)
RDW: 13.9 % (ref 11.5–15.5)
WBC: 6.6 10*3/uL (ref 4.0–10.5)
nRBC: 0 % (ref 0.0–0.2)

## 2023-02-19 LAB — LIPASE, BLOOD: Lipase: 28 U/L (ref 11–51)

## 2023-02-19 LAB — HCG, SERUM, QUALITATIVE: Preg, Serum: NEGATIVE

## 2023-02-19 MED ORDER — DIPHENHYDRAMINE HCL 50 MG/ML IJ SOLN
25.0000 mg | Freq: Once | INTRAMUSCULAR | Status: AC
  Administered 2023-02-19: 25 mg via INTRAVENOUS
  Filled 2023-02-19: qty 1

## 2023-02-19 MED ORDER — IOHEXOL 350 MG/ML SOLN
75.0000 mL | Freq: Once | INTRAVENOUS | Status: AC | PRN
  Administered 2023-02-19: 75 mL via INTRAVENOUS

## 2023-02-19 MED ORDER — PREDNISONE 10 MG PO TABS: ORAL_TABLET | ORAL | 0 refills | Status: AC

## 2023-02-19 MED ORDER — ONDANSETRON HCL 4 MG/2ML IJ SOLN: 4.0000 mg | Freq: Once | INTRAMUSCULAR | Status: DC

## 2023-02-19 MED ORDER — MORPHINE SULFATE (PF) 4 MG/ML IV SOLN
4.0000 mg | Freq: Once | INTRAVENOUS | Status: AC
  Administered 2023-02-19: 4 mg via INTRAVENOUS
  Filled 2023-02-19: qty 1

## 2023-02-19 MED ORDER — PROCHLORPERAZINE EDISYLATE 10 MG/2ML IJ SOLN
10.0000 mg | Freq: Once | INTRAMUSCULAR | Status: AC
  Administered 2023-02-19: 10 mg via INTRAVENOUS
  Filled 2023-02-19: qty 2

## 2023-02-19 MED ORDER — POTASSIUM CHLORIDE CRYS ER 20 MEQ PO TBCR
40.0000 meq | EXTENDED_RELEASE_TABLET | Freq: Once | ORAL | Status: AC
  Administered 2023-02-19: 40 meq via ORAL
  Filled 2023-02-19: qty 2

## 2023-02-19 MED ORDER — LACTATED RINGERS IV BOLUS
1000.0000 mL | Freq: Once | INTRAVENOUS | Status: AC
  Administered 2023-02-19: 1000 mL via INTRAVENOUS

## 2023-02-19 NOTE — Discharge Instructions (Addendum)
You were seen in the emerged from today for evaluation of your lower abdominal pain and blood in your stool.  Your CT shows signs of colitis.  I spoke with one of the providers with the [facility] and they are recommending prednisone and to follow-up in the office.  They have also recommend getting stool cultures however you are been unable to provide these today.  I would try to provide them a stool sample before your appointment for further testing.  Please make sure you call [facility] to infer about stool testing and also an appointment. They have recommended steroids for your symptoms. Please take as prescribed.  In the meantime, please make sure you are staying well-hydrated drinking plenty of fluids, mainly water.  Your potassium here which showed to be moderately decreased, please make sure you follow with your primary care doctor in the next few days for retesting of this.  If you start having a fever, worsening pain, more blood in your stool, please return your nearest for department for evaluation.  We have any other concerns, new or worsening symptoms, please return to your nearest Emergency Department for evaluation.  Contact a health care provider if: Your symptoms do not go away. You develop new symptoms. Get help right away if: You have a fever that does not go away with treatment. You develop chills. You have extreme weakness, fainting, or dehydration. You vomit repeatedly. You develop severe pain in your abdomen. You pass bloody or tarry stool.

## 2023-02-19 NOTE — ED Triage Notes (Signed)
Complaining of abdominal pain from the umbilicus down to pelvis and goes around to the back. She also is having some nausea, headache, and diarrhea with it. Hx of ulcerative colitis. All of her symptoms started a week ago.

## 2023-02-19 NOTE — ED Provider Notes (Signed)
[place] EMERGENCY DEPARTMENT AT [facility] Provider Note   CSN: [id number] Arrival date & time: 02/19/23  0303     History  Chief Complaint  Patient presents with   Abdominal Pain    Annette Thomas is a 52 y.o. female with history of hypertension ulcerative colitis sees [facility] presents the emerged from today for evaluation of abdominal pain mainly in the lower abdomen gradually worsening for the past week.  She reports it feels sharp/spasm.  She reports that this morning she noticed some blood in the toilet but not any on the toilet paper.  This concerned her.  She reports that she was on mesalamine 2 years ago but stopped due to she was not having any flareups.  Has not been on any medications since.  She reports that her stool smells different as well.  Denies any melena.  Reports she was having some nausea starting yesterday but no vomiting.  Denies any fevers.  She also reports that she been having a dull frontal headache for the past 3 days.  Denies any neck pain or neck stiffness.  She denies any dysuria, hematuria, vision changes, trouble walking, trouble talking, rhinorrhea, nasal congestion.  She reports that she did have this flu 3 weeks ago.  She is not having any chest pain or shortness of breath.  Additionally, she reports decreased pain is usually worse whenever she eats food.  She has not been eating or drinking at her usual state.  Surgical history includes cholecystectomy.  Daily medications include amlodipine and multivitamin.  No known drug allergies.  Denies any tobacco, EtOH, other drug use.   Abdominal Pain Associated symptoms: nausea   Associated symptoms: no chest pain, no chills, no constipation, no diarrhea, no dysuria, no fever, no hematuria, no shortness of breath, no vaginal bleeding, no vaginal discharge and no vomiting        Home Medications Prior to Admission medications   Medication Sig Start Date End Date Taking? Authorizing  Provider  amLODipine (NORVASC) 5 MG tablet Take 5 mg by mouth daily.    [provider]  Cetirizine HCl 10 MG CAPS Take 1 capsule (10 mg total) by mouth daily for 10 days. 02/23/19 03/11/19  Wieters, Hallie C, PA-C  fluticasone (FLONASE) 50 MCG/ACT nasal spray Place 1-2 sprays into both nostrils daily for 7 days. 02/23/19 03/11/19  Wieters, Hallie C, PA-C  ibuprofen (ADVIL) 600 MG tablet Take 1 tablet (600 mg total) by mouth every 6 (six) hours as needed. 05/31/21   Domenick Gong, MD  Multiple Vitamin (MULTIVITAMIN) tablet Take 1 tablet by mouth daily.    [provider]  vitamin C (ASCORBIC ACID) 250 MG tablet Take 1 tablet (250 mg total) by mouth daily. 03/14/19   Jerald Kief, MD  VITAMIN D, ERGOCALCIFEROL, PO Take 5,000 Units by mouth daily.    [provider]  Zinc Sulfate 220 (50 Zn) MG TABS Take 1 tablet (220 mg total) by mouth daily. 03/14/19   Jerald Kief, MD  hydrochlorothiazide (MICROZIDE) 12.5 MG capsule Take 1 capsule (12.5 mg total) by mouth daily. Patient not taking: Reported on 10/04/2015 08/04/11 02/23/19  Collene Gobble, MD  magic mouthwash SOLN 1: 1 benadryl to maalox 10/04/15 02/23/19  Melene Plan, DO      Allergies    Patient has no known allergies.    Review of Systems   Review of Systems  Constitutional:  Negative for chills and fever.  HENT:  Negative  for congestion and rhinorrhea.   Eyes:  Negative for photophobia and visual disturbance.  Respiratory:  Negative for shortness of breath.   Cardiovascular:  Negative for chest pain.  Gastrointestinal:  Positive for abdominal pain, blood in stool and nausea. Negative for constipation, diarrhea and vomiting.  Genitourinary:  Negative for dysuria, hematuria, pelvic pain, vaginal bleeding and vaginal discharge.  Musculoskeletal:  Negative for gait problem.  Neurological:  Positive for headaches. Negative for dizziness, speech difficulty and light-headedness.    Physical Exam Updated Vital  Signs BP 123/78   Pulse 79   Temp 98.3 F (36.8 C)   Resp 17   Ht 5\' 7"  (1.702 m)   Wt 79.4 kg   SpO2 99%   BMI 27.41 kg/m  Physical Exam Vitals and nursing note reviewed.  Constitutional:      General: She is not in acute distress.    Appearance: She is not ill-appearing or toxic-appearing.  HENT:     Right Ear: Tympanic membrane, ear canal and external ear normal.     Left Ear: Tympanic membrane, ear canal and external ear normal.     Mouth/Throat:     Mouth: Mucous membranes are dry.     Pharynx: Oropharynx is clear.  Eyes:     General: No scleral icterus. Cardiovascular:     Rate and Rhythm: Normal rate.  Pulmonary:     Effort: Pulmonary effort is normal. No respiratory distress.  Abdominal:     General: Bowel sounds are normal. There is no distension.     Palpations: Abdomen is soft.     Tenderness: There is generalized abdominal tenderness. There is no guarding or rebound.     Comments: No overlying skin changes noted.  Diffuse abdominal tenderness palpation of the LIMA lower aspect.  Normal active bowel sounds.  Soft, nondistended.  Musculoskeletal:     Cervical back: Normal range of motion. No rigidity.  Skin:    General: Skin is warm and dry.  Neurological:     General: No focal deficit present.     Mental Status: She is alert.     GCS: GCS eye subscore is 4. GCS verbal subscore is 5. GCS motor subscore is 6.     Cranial Nerves: No cranial nerve deficit, dysarthria or facial asymmetry.     Sensory: No sensory deficit.     Motor: No weakness or pronator drift.     Coordination: Finger-Nose-Finger Test normal.     Comments: Answering questions appropriately with appropriate speech.  GCS 15.  Cranial nerves II through XII intact.  Sensation reportedly intact symmetric per patient.  No facial asymmetry.  Strength is 5 of 5 in patient's upper and lower bilateral extremities.  No pronator drift.  Normal finger-nose-finger.     ED Results / Procedures / Treatments    Labs (all labs ordered are listed, but only abnormal results are displayed) Labs Reviewed  COMPREHENSIVE METABOLIC PANEL - Abnormal; Notable for the following components:      Result Value   Potassium 3.1 (*)    Glucose, Bld 169 (*)    Creatinine, Ser 1.09 (*)    Albumin 3.3 (*)    All other components within normal limits  LIPASE, BLOOD  CBC  URINALYSIS, ROUTINE W REFLEX MICROSCOPIC  HCG, SERUM, QUALITATIVE    EKG None  Radiology CT ABDOMEN PELVIS W CONTRAST  Result Date: 02/19/2023 CLINICAL DATA:  History of ulcerative colitis. Lower abdominal pain and hematochezia EXAM: CT ABDOMEN AND PELVIS WITH CONTRAST  TECHNIQUE: Multidetector CT imaging of the abdomen and pelvis was performed using the standard protocol following bolus administration of intravenous contrast. RADIATION DOSE REDUCTION: This exam was performed according to the departmental dose-optimization program which includes automated exposure control, adjustment of the mA and/or kV according to patient size and/or use of iterative reconstruction technique. CONTRAST:  75mL OMNIPAQUE IOHEXOL 350 MG/ML SOLN COMPARISON:  CT 03/18/2019 FINDINGS: Lower chest: There is some linear opacity lung bases likely scar or atelectasis. No pleural effusion. Hepatobiliary: Previous cholecystectomy. Slight prominence of the biliary tree but unchanged from previous. Benign cystic lesion again identified in the dome of the right side measuring a proximally 2.9 cm. No imaging follow-up. Patent portal vein. Pancreas: Unremarkable. No pancreatic ductal dilatation or surrounding inflammatory changes. Spleen: Normal in size without focal abnormality. Small splenule anteriorly Adrenals/Urinary Tract: Adrenal glands are preserved. Kidneys have normal course and caliber extending down to the bladder. Preserved contours of the urinary bladder. Stomach/Bowel: On this non oral contrast exam, the stomach and small bowel are nondilated. Normal appendix seen  retrocecal in the right lower quadrant. The large bowel has scattered colonic diverticula. Several portions of the colon are nondistended but there are some subtle areas of wall thickening such as near the splenic flexure, transverse colon. A subtle colitis is possible. There is also some areas along the descending colon. Vascular/Lymphatic: Normal caliber aorta and IVC. There are some small mesenteric nodes identified, nonpathologic by size criteria. Reproductive: Enlarged lobular uterus with multiple fibroids. Some calcifications. No separate adnexal mass Other: No free intra-air or free fluid. Musculoskeletal: No acute or significant osseous findings. IMPRESSION: Mild areas of wall thickening along the colon including transverse, splenic flexure and descending colon. Please correlate for history of ulcerative colitis. Colonic diverticulosis also identified. No obstruction, free air or free fluid.  Normal appendix. Fibroid uterus. Electronically Signed   By: Karen Kays M.D.   On: 02/19/2023 10:18    Procedures Procedures   Medications Ordered in ED Medications  potassium chloride SA (KLOR-CON M) CR tablet 40 mEq (40 mEq Oral Given 02/19/23 0756)  lactated ringers bolus 1,000 mL (0 mLs Intravenous Stopped 02/19/23 0936)  morphine (PF) 4 MG/ML injection 4 mg (4 mg Intravenous Given 02/19/23 0755)  prochlorperazine (COMPAZINE) injection 10 mg (10 mg Intravenous Given 02/19/23 0756)  diphenhydrAMINE (BENADRYL) injection 25 mg (25 mg Intravenous Given 02/19/23 0755)  iohexol (OMNIPAQUE) 350 MG/ML injection 75 mL (75 mLs Intravenous Contrast Given 02/19/23 0924)    ED Course/ Medical Decision Making/ A&P Clinical Course as of 02/19/23 1035  Mon Feb 19, 2023  1610 On re-evaluation, the patient reports her headache has greatly improved. Overall looks comfortable reclined on stretcher. Still awaiting CT scan.  [RR]    Clinical Course User Index [RR] Achille Rich, PA-C                                Medical Decision Making Amount and/or Complexity of Data Reviewed Labs: ordered. Radiology: ordered.  Risk Prescription drug management.   52 y.o. female presents to the ER for evaluation of lower abdominal pain with headache. Differential diagnosis includes but is not limited to AAA, mesenteric ischemia, appendicitis, diverticulitis, DKA, gastroenteritis, nephrolithiasis, pancreatitis, constipation, UTI, bowel obstruction, biliary disease, IBD, PUD, hepatitis, ectopic pregnancy, ovarian torsion, PID. Vital signs unremarkable. Physical exam as noted above.   I independently reviewed and interpreted the patient's labs.  Lipase unremarkable.  CBC without leukocytosis or  anemia.  hCG is negative.  Urinalysis unremarkable.  CMP does show mild hypokalemia at 3.1 with a glucose of 169.  Creatinine 1.09.  Albumin at 3.3.  Encouraged patient to follow-up with her PCP.  Appears that the mild hyperglycemia was present on her previous labs in years ago as well.  She denies any history of diabetes..  CT abd pelvis shows mild areas of wall thickening along the colon including transverse, splenic flexure and descending colon. Please correlate for history of ulcerative colitis. Colonic diverticulosis also identified. No obstruction, free air or free fluid.  Normal appendix. Fibroid uterus.   I consulted [facility] and spoke with Dr. Lorenso Quarry.  She recommended starting the patient on a prednisone taper.  Will give her a month worth and hopefully the patient will be seen before then.  Dr. Lorenso Quarry also asked if we can see the patient to provide a stool sample.  Unfortunate, the patient was unable to provide a stool sample.  I gave her a cup and specimen bag.  I alerted Dr. Lorenso Quarry via secure chat if she could let her staff know that the patient will try to drop a stool sample off for testing.  While here, the patient received 1 L of LR, potassium, Compazine, Benadryl, and morphine.  Overall, patient reports her  headaches completely resolved.  She is feeling much better.  I think her headache was likely from dehydration given that she was having some dry mucous membranes, likely from decreased p.o. intake because of the pain.  I think her lower abdominal pain is likely from her colitis flare.  There is no abscess and development.  Her vital signs are stable, she is not having leukocytosis.  She reports that she is feeling much better.  I discussed with her about the prednisone use and Dr. Earlie Counts recommendation for stool sampling and following up in clinic.  Overall, she is afebrile and does not appear in any acute distress.  She is stable for discharge home with close outpatient follow-up with GI.  We discussed the results of the labs/imaging. The plan is take medications prescribed, provide stool sample, follow-up with GI. We discussed strict return precautions and red flag symptoms. The patient verbalized their understanding and agrees to the plan. The patient is stable and being discharged home in good condition.  Portions of this report may have been transcribed using voice recognition software. Every effort was made to ensure accuracy; however, inadvertent computerized transcription errors may be present.   Final Clinical Impression(s) / ED Diagnoses Final diagnoses:  Colitis  Hypokalemia  Generalized headache    Rx / DC Orders ED Discharge Orders          Ordered    predniSONE (DELTASONE) 10 MG tablet  Daily        02/19/23 1155              Achille Rich, PA-C 02/21/23 2349    Elayne Snare K, DO 02/24/23 [phone]

## 2023-03-06 DIAGNOSIS — R1084 Generalized abdominal pain: Secondary | ICD-10-CM | POA: Diagnosis not present

## 2023-03-06 DIAGNOSIS — K5190 Ulcerative colitis, unspecified, without complications: Secondary | ICD-10-CM | POA: Diagnosis not present

## 2023-03-06 DIAGNOSIS — R197 Diarrhea, unspecified: Secondary | ICD-10-CM | POA: Diagnosis not present

## 2023-03-28 ENCOUNTER — Encounter (HOSPITAL_BASED_OUTPATIENT_CLINIC_OR_DEPARTMENT_OTHER): Payer: Self-pay | Admitting: Orthopedic Surgery

## 2023-04-02 ENCOUNTER — Encounter (HOSPITAL_BASED_OUTPATIENT_CLINIC_OR_DEPARTMENT_OTHER)
Admission: RE | Admit: 2023-04-02 | Discharge: 2023-04-02 | Disposition: A | Discharge status: Home / Self Care | Payer: No Typology Code available for payment source | Source: Ambulatory Visit | Attending: Orthopedic Surgery | Admitting: Orthopedic Surgery

## 2023-04-02 DIAGNOSIS — Z01812 Encounter for preprocedural laboratory examination: Secondary | ICD-10-CM | POA: Insufficient documentation

## 2023-04-02 DIAGNOSIS — K529 Noninfective gastroenteritis and colitis, unspecified: Secondary | ICD-10-CM | POA: Diagnosis not present

## 2023-04-02 NOTE — Progress Notes (Signed)

## 2023-04-04 DIAGNOSIS — M2142 Flat foot [pes planus] (acquired), left foot: Secondary | ICD-10-CM | POA: Diagnosis not present

## 2023-04-04 DIAGNOSIS — R252 Cramp and spasm: Secondary | ICD-10-CM | POA: Diagnosis not present

## 2023-04-04 DIAGNOSIS — K5190 Ulcerative colitis, unspecified, without complications: Secondary | ICD-10-CM | POA: Diagnosis not present

## 2023-04-04 DIAGNOSIS — F418 Other specified anxiety disorders: Secondary | ICD-10-CM | POA: Diagnosis not present

## 2023-04-04 DIAGNOSIS — I10 Essential (primary) hypertension: Secondary | ICD-10-CM | POA: Diagnosis not present

## 2023-04-09 ENCOUNTER — Encounter (HOSPITAL_BASED_OUTPATIENT_CLINIC_OR_DEPARTMENT_OTHER): Payer: Self-pay | Admitting: Orthopedic Surgery

## 2023-04-09 ENCOUNTER — Ambulatory Visit (HOSPITAL_BASED_OUTPATIENT_CLINIC_OR_DEPARTMENT_OTHER)
Admission: RE | Admit: 2023-04-09 | Discharge: 2023-04-09 | Disposition: A | Discharge status: Home / Self Care | Payer: No Typology Code available for payment source | Attending: Orthopedic Surgery | Admitting: Orthopedic Surgery

## 2023-04-09 ENCOUNTER — Other Ambulatory Visit: Payer: Self-pay

## 2023-04-09 ENCOUNTER — Encounter (HOSPITAL_BASED_OUTPATIENT_CLINIC_OR_DEPARTMENT_OTHER)
Admission: RE | Disposition: A | Discharge status: Home / Self Care | Payer: Self-pay | Source: Home / Self Care | Attending: Orthopedic Surgery

## 2023-04-09 ENCOUNTER — Ambulatory Visit (HOSPITAL_BASED_OUTPATIENT_CLINIC_OR_DEPARTMENT_OTHER): Payer: No Typology Code available for payment source | Admitting: Anesthesiology

## 2023-04-09 DIAGNOSIS — I10 Essential (primary) hypertension: Secondary | ICD-10-CM | POA: Insufficient documentation

## 2023-04-09 DIAGNOSIS — M654 Radial styloid tenosynovitis [de Quervain]: Secondary | ICD-10-CM | POA: Diagnosis present

## 2023-04-09 DIAGNOSIS — Z01818 Encounter for other preprocedural examination: Secondary | ICD-10-CM

## 2023-04-09 DIAGNOSIS — Z79899 Other long term (current) drug therapy: Secondary | ICD-10-CM | POA: Insufficient documentation

## 2023-04-09 DIAGNOSIS — M67432 Ganglion, left wrist: Secondary | ICD-10-CM | POA: Diagnosis not present

## 2023-04-09 HISTORY — PX: GANGLION CYST EXCISION: SHX1691

## 2023-04-09 HISTORY — DX: Essential (primary) hypertension: I10

## 2023-04-09 HISTORY — PX: DORSAL COMPARTMENT RELEASE: SHX5039

## 2023-04-09 SURGERY — RELEASE, FIRST DORSAL COMPARTMENT, HAND
Anesthesia: General | Site: Wrist | Laterality: Left

## 2023-04-09 MED ORDER — CEFAZOLIN SODIUM-DEXTROSE 2-4 GM/100ML-% IV SOLN
2.0000 g | INTRAVENOUS | Status: AC
  Administered 2023-04-09: 2 g via INTRAVENOUS

## 2023-04-09 MED ORDER — SODIUM CHLORIDE 0.9 % IV SOLN: INTRAVENOUS | Status: DC | PRN

## 2023-04-09 MED ORDER — FENTANYL CITRATE (PF) 100 MCG/2ML IJ SOLN
INTRAMUSCULAR | Status: DC | PRN
  Administered 2023-04-09: 50 ug via INTRAVENOUS

## 2023-04-09 MED ORDER — FENTANYL CITRATE (PF) 100 MCG/2ML IJ SOLN: 25.0000 ug | INTRAMUSCULAR | Status: DC | PRN

## 2023-04-09 MED ORDER — LIDOCAINE 2% (20 MG/ML) 5 ML SYRINGE
INTRAMUSCULAR | Status: AC
  Filled 2023-04-09: qty 5

## 2023-04-09 MED ORDER — PROPOFOL 10 MG/ML IV BOLUS
INTRAVENOUS | Status: AC
  Filled 2023-04-09: qty 20

## 2023-04-09 MED ORDER — LACTATED RINGERS IV SOLN: INTRAVENOUS | Status: DC

## 2023-04-09 MED ORDER — PHENYLEPHRINE 80 MCG/ML (10ML) SYRINGE FOR IV PUSH (FOR BLOOD PRESSURE SUPPORT)
PREFILLED_SYRINGE | INTRAVENOUS | Status: AC
  Filled 2023-04-09: qty 10

## 2023-04-09 MED ORDER — ONDANSETRON HCL 4 MG/2ML IJ SOLN
INTRAMUSCULAR | Status: DC | PRN
  Administered 2023-04-09: 4 mg via INTRAVENOUS

## 2023-04-09 MED ORDER — PHENYLEPHRINE 80 MCG/ML (10ML) SYRINGE FOR IV PUSH (FOR BLOOD PRESSURE SUPPORT)
PREFILLED_SYRINGE | INTRAVENOUS | Status: DC | PRN
  Administered 2023-04-09: 160 ug via INTRAVENOUS

## 2023-04-09 MED ORDER — PHENYLEPHRINE HCL (PRESSORS) 10 MG/ML IV SOLN
INTRAVENOUS | Status: DC | PRN
  Administered 2023-04-09: 80 ug via INTRAVENOUS

## 2023-04-09 MED ORDER — ACETAMINOPHEN 500 MG PO TABS
ORAL_TABLET | ORAL | Status: AC
  Filled 2023-04-09: qty 2

## 2023-04-09 MED ORDER — MIDAZOLAM HCL 2 MG/2ML IJ SOLN
INTRAMUSCULAR | Status: AC
Start: 2023-04-09 — End: ?
  Filled 2023-04-09: qty 2

## 2023-04-09 MED ORDER — ONDANSETRON HCL 4 MG/2ML IJ SOLN
INTRAMUSCULAR | Status: AC
Start: 2023-04-09 — End: ?
  Filled 2023-04-09: qty 2

## 2023-04-09 MED ORDER — 0.9 % SODIUM CHLORIDE (POUR BTL) OPTIME
TOPICAL | Status: DC | PRN
  Administered 2023-04-09: 1000 mL

## 2023-04-09 MED ORDER — CEFAZOLIN SODIUM-DEXTROSE 2-4 GM/100ML-% IV SOLN
INTRAVENOUS | Status: AC
  Filled 2023-04-09: qty 100

## 2023-04-09 MED ORDER — FENTANYL CITRATE (PF) 100 MCG/2ML IJ SOLN
INTRAMUSCULAR | Status: AC
  Filled 2023-04-09: qty 2

## 2023-04-09 MED ORDER — ACETAMINOPHEN 500 MG PO TABS
1000.0000 mg | ORAL_TABLET | Freq: Once | ORAL | Status: AC
  Administered 2023-04-09: 1000 mg via ORAL

## 2023-04-09 MED ORDER — DEXAMETHASONE SODIUM PHOSPHATE 10 MG/ML IJ SOLN
INTRAMUSCULAR | Status: DC | PRN
  Administered 2023-04-09: 10 mg via INTRAVENOUS

## 2023-04-09 MED ORDER — AMISULPRIDE (ANTIEMETIC) 5 MG/2ML IV SOLN: 10.0000 mg | Freq: Once | INTRAVENOUS | Status: DC | PRN

## 2023-04-09 MED ORDER — LIDOCAINE 2% (20 MG/ML) 5 ML SYRINGE
INTRAMUSCULAR | Status: DC | PRN
  Administered 2023-04-09: 80 mg via INTRAVENOUS

## 2023-04-09 MED ORDER — TRAMADOL HCL 50 MG PO TABS: ORAL_TABLET | ORAL | 0 refills | Status: AC

## 2023-04-09 MED ORDER — PROPOFOL 10 MG/ML IV BOLUS
INTRAVENOUS | Status: DC | PRN
  Administered 2023-04-09: 170 mg via INTRAVENOUS

## 2023-04-09 MED ORDER — ONDANSETRON HCL 4 MG/2ML IJ SOLN: 4.0000 mg | Freq: Once | INTRAMUSCULAR | Status: DC | PRN

## 2023-04-09 MED ORDER — BUPIVACAINE HCL (PF) 0.25 % IJ SOLN
INTRAMUSCULAR | Status: DC | PRN
  Administered 2023-04-09: 9 mL

## 2023-04-09 MED ORDER — DEXAMETHASONE SODIUM PHOSPHATE 10 MG/ML IJ SOLN
INTRAMUSCULAR | Status: AC
  Filled 2023-04-09: qty 1

## 2023-04-09 MED ORDER — MIDAZOLAM HCL 5 MG/5ML IJ SOLN
INTRAMUSCULAR | Status: DC | PRN
  Administered 2023-04-09: 2 mg via INTRAVENOUS

## 2023-04-09 SURGICAL SUPPLY — 38 items
BENZOIN TINCTURE PRP APPL 2/3 (GAUZE/BANDAGES/DRESSINGS) IMPLANT
BLADE MINI RND TIP GREEN BEAV (BLADE) IMPLANT
BLADE SURG 15 STRL LF DISP TIS (BLADE) ×4 IMPLANT
BNDG ELASTIC 2INX 5YD STR LF (GAUZE/BANDAGES/DRESSINGS) IMPLANT
BNDG ELASTIC 3INX 5YD STR LF (GAUZE/BANDAGES/DRESSINGS) ×2 IMPLANT
BNDG ESMARK 4X9 LF (GAUZE/BANDAGES/DRESSINGS) IMPLANT
BNDG GAUZE DERMACEA FLUFF 4 (GAUZE/BANDAGES/DRESSINGS) ×2 IMPLANT
CHLORAPREP W/TINT 26 (MISCELLANEOUS) ×2 IMPLANT
CORD BIPOLAR FORCEPS 12FT (ELECTRODE) ×2 IMPLANT
COVER BACK TABLE 60X90IN (DRAPES) ×2 IMPLANT
COVER MAYO STAND STRL (DRAPES) ×2 IMPLANT
CUFF TOURN SGL QUICK 18X4 (TOURNIQUET CUFF) ×2 IMPLANT
DRAPE EXTREMITY T 121X128X90 (DISPOSABLE) ×2 IMPLANT
DRAPE SURG 17X23 STRL (DRAPES) ×2 IMPLANT
GAUZE PAD ABD 8X10 STRL (GAUZE/BANDAGES/DRESSINGS) ×2 IMPLANT
GAUZE SPONGE 4X4 12PLY STRL (GAUZE/BANDAGES/DRESSINGS) ×2 IMPLANT
GAUZE XEROFORM 1X8 LF (GAUZE/BANDAGES/DRESSINGS) ×2 IMPLANT
GLOVE BIO SURGEON STRL SZ7.5 (GLOVE) ×2 IMPLANT
GLOVE BIOGEL PI IND STRL 8 (GLOVE) ×2 IMPLANT
GOWN STRL REUS W/ TWL LRG LVL3 (GOWN DISPOSABLE) ×2 IMPLANT
GOWN STRL REUS W/TWL XL LVL3 (GOWN DISPOSABLE) ×2 IMPLANT
NDL HYPO 25X1 1.5 SAFETY (NEEDLE) ×2 IMPLANT
NEEDLE HYPO 25X1 1.5 SAFETY (NEEDLE) ×1 IMPLANT
NS IRRIG 1000ML POUR BTL (IV SOLUTION) ×2 IMPLANT
PACK BASIN DAY SURGERY FS (CUSTOM PROCEDURE TRAY) ×2 IMPLANT
PAD CAST 3X4 CTTN HI CHSV (CAST SUPPLIES) IMPLANT
PADDING CAST ABS COTTON 4X4 ST (CAST SUPPLIES) ×2 IMPLANT
SPLINT PLASTER CAST XFAST 3X15 (CAST SUPPLIES) IMPLANT
STOCKINETTE 4X48 STRL (DRAPES) ×2 IMPLANT
STRIP CLOSURE SKIN 1/2X4 (GAUZE/BANDAGES/DRESSINGS) IMPLANT
SUT ETHILON 4 0 PS 2 18 (SUTURE) ×2 IMPLANT
SUT MNCRL AB 4-0 PS2 18 (SUTURE) IMPLANT
SUT MON AB 5-0 PS2 18 (SUTURE) IMPLANT
SUT VIC AB 4-0 P2 18 (SUTURE) IMPLANT
SYR BULB EAR ULCER 3OZ GRN STR (SYRINGE) ×2 IMPLANT
SYR CONTROL 10ML LL (SYRINGE) ×2 IMPLANT
TOWEL GREEN STERILE FF (TOWEL DISPOSABLE) ×4 IMPLANT
UNDERPAD 30X36 HEAVY ABSORB (UNDERPADS AND DIAPERS) ×2 IMPLANT

## 2023-04-09 NOTE — Discharge Instructions (Addendum)
[facility] Instructions Hand Surgery  Wound Care: Keep your hand elevated above the level of your heart.  Do not allow it to dangle by your side.  Keep the dressing dry and do not remove it unless your doctor advises you to do so.  He will usually change it at the time of your post-op visit.  Moving your fingers is advised to stimulate circulation but will depend on the site of your surgery.  If you have a splint applied, your doctor will advise you regarding movement.  Activity: Do not drive or operate machinery today.  Rest today and then you may return to your normal activity and work as indicated by your physician.  Diet:  Drink liquids today or eat a light diet.  You may resume a regular diet tomorrow.    General expectations: Pain for two to three days. Fingers may become slightly swollen.  Call your doctor if any of the following occur: Severe pain not relieved by pain medication. Elevated temperature. Dressing soaked with blood. Inability to move fingers. White or bluish color to fingers.   No Tylenol until after 6:00pm today, if needed.    Post Anesthesia Home Care Instructions  Activity: Get plenty of rest for the remainder of the day. A responsible individual must stay with you for 24 hours following the procedure.  For the next 24 hours, DO NOT: -Drive a car -Advertising copywriter -Drink alcoholic beverages -Take any medication unless instructed by your physician -Make any legal decisions or sign important papers.  Meals: Start with liquid foods such as gelatin or soup. Progress to regular foods as tolerated. Avoid greasy, spicy, heavy foods. If nausea and/or vomiting occur, drink only clear liquids until the nausea and/or vomiting subsides. Call your physician if vomiting continues.  Special Instructions/Symptoms: Your throat may feel dry or sore from the anesthesia or the breathing tube placed in your throat during surgery. If this causes discomfort, gargle with  warm salt water. The discomfort should disappear within 24 hours.  If you had a scopolamine patch placed behind your ear for the management of post- operative nausea and/or vomiting:  1. The medication in the patch is effective for 72 hours, after which it should be removed.  Wrap patch in a tissue and discard in the trash. Wash hands thoroughly with soap and water. 2. You may remove the patch earlier than 72 hours if you experience unpleasant side effects which may include dry mouth, dizziness or visual disturbances. 3. Avoid touching the patch. Wash your hands with soap and water after contact with the patch.

## 2023-04-09 NOTE — Anesthesia Preprocedure Evaluation (Addendum)
Anesthesia Evaluation  Patient identified by MRN, date of birth, ID band Patient awake    Reviewed: Allergy & Precautions, NPO status , Patient's Chart, lab work & pertinent test results  Airway Mallampati: II  TM Distance: >3 FB Neck ROM: Full    Dental  (+) Teeth Intact, Dental Advisory Given   Pulmonary neg pulmonary ROS   Pulmonary exam normal breath sounds clear to auscultation       Cardiovascular hypertension, Pt. on medications Normal cardiovascular exam Rhythm:Regular Rate:Normal     Neuro/Psych negative neurological ROS     GI/Hepatic Neg liver ROS, PUD,,,  Endo/Other  negative endocrine ROS    Renal/GU negative Renal ROS     Musculoskeletal  LEFT DEQUERVAIN'S TENOSYNOVITIS AND GANGLION CYST   Abdominal   Peds  Hematology negative hematology ROS (+)   Anesthesia Other Findings Day of surgery medications reviewed with the patient.  Reproductive/Obstetrics                             Anesthesia Physical Anesthesia Plan  ASA: 2  Anesthesia Plan: General   Post-op Pain Management: Tylenol PO (pre-op)*   Induction: Intravenous  PONV Risk Score and Plan: 3 and Midazolam, Dexamethasone and Ondansetron  Airway Management Planned: LMA  Additional Equipment:   Intra-op Plan:   Post-operative Plan: Extubation in OR  Informed Consent: I have reviewed the patients History and Physical, chart, labs and discussed the procedure including the risks, benefits and alternatives for the proposed anesthesia with the patient or authorized representative who has indicated his/her understanding and acceptance.     Dental advisory given  Plan Discussed with: CRNA  Anesthesia Plan Comments:        Anesthesia Quick Evaluation

## 2023-04-09 NOTE — Op Note (Signed)
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  Left first dorsal compartment release 2.  Excision left wrist ganglion/retinacular thickening   SURGEON:  Betha Loa, M.D.   ASSISTANT: none   ANESTHESIA:  General   INTRAVENOUS FLUIDS:  Per anesthesia flow sheet.   ESTIMATED BLOOD LOSS:  Minimal.   COMPLICATIONS:  None.   SPECIMENS: Ganglion/retinacular thickening to the pathology   TOURNIQUET TIME:    Total Tourniquet Time Documented: Upper Arm (Left) - 15 minutes Total: Upper Arm (Left) - 15 minutes    DISPOSITION:  Stable to PACU.   INDICATIONS: 52 year old female with left de Quervain's tenosynovitis and associated ganglion.  She wishes to have the first dorsal apartment released and the ganglion excised.  Risks, benefits and alternatives of surgery were discussed including the risks of blood loss, infection, damage to nerves, vessels, tendons, ligaments, bone for surgery, need for additional surgery, complications with wound healing, continued pain, stiffness, , recurrence.  She voiced understanding of these risks and elected to proceed.  OPERATIVE COURSE:  After being identified preoperatively by myself,  the patient and I agreed on the procedure and site of the procedure.  The surgical site was marked.  Surgical consent had been signed. Preoperative IV antibiotic prophylaxis was given. She was transferred to the operating room and placed on the operating table in supine position with the left upper extremity on an arm board.  General anesthesia was induced by the anesthesiologist.   Left upper extremity was prepped and draped in normal sterile orthopedic fashion.  A surgical pause was performed between the surgeons, anesthesia, and operating room staff and all were in agreement as to the patient, procedure, and site of procedure.  Tourniquet at the proximal aspect of the extremity was inflated to 250 mmHg after exsanguination of the arm with an Esmarch bandage.  A chevron incision was made over the first dorsal compartment.  This was carried into subcutaneous tissues by spreading technique.  A dorsal branch of the superficial branch of radial nerve was identified and protected throughout the case.  The first dorsal apartment retinaculum was noted to be significantly thickened.  The APL tendon was identified and the first dorsal compartment release from distal to proximal under direct visualizattion.  There was a separate compartment for the EPB tendon.  This was also released and the septum between the 2 was removed.  There was thickening of the extensor retinaculum at the first dorsal compartment.  This thickened portion was excised and sent to pathology for examination.  There was some brownish discoloration within the thickened area.  The wound was copiously irrigated with sterile saline.  Was closed with 4-0 nylon in a horizontal mattress fashion.  The branch of radial nerve was placed into the posterior subcutaneous tissues away from the released retinaculum.  The wound was injected with quarter percent plain Marcaine to aid in postoperative analgesia.  Was dressed with sterile Xeroform 4 x 4's and an ABD used as a splint.  This was wrapped with Kerlix and Ace bandage.  The tourniquet was deflated at 15 minutes.  Fingertips were pink with brisk capillary refill after deflation of tourniquet.  The operative  drapes were broken down.  The patient was awoken from anesthesia safely.  She was transferred back to the stretcher and taken to PACU in stable condition.  I will see her back in the  office in 1 week for postoperative followup.  I will give her a prescription for Tramadol 50 mg 1-2 tabs PO q6 hours prn pain, dispense # 20.   Betha Loa, MD Electronically signed, 04/09/23

## 2023-04-09 NOTE — Transfer of Care (Signed)
Immediate Anesthesia Transfer of Care Note  Patient: Annette Thomas  Procedure(s) Performed: LEFT 1ST DORSAL COMPARTMENT RELEASE (Left: Wrist) REMOVAL GANGLION OF WRIST (Left: Wrist)  Patient Location: PACU  Anesthesia Type:General  Level of Consciousness: drowsy and patient cooperative  Airway & Oxygen Therapy: Patient Spontanous Breathing and Patient connected to face mask oxygen  Post-op Assessment: Report given to RN and Post -op Vital signs reviewed and stable  Post vital signs: Reviewed and stable  Last Vitals:  Vitals Value Taken Time  BP    Temp    Pulse 67 04/09/23 1355  Resp    SpO2 99 % 04/09/23 1355  Vitals shown include unfiled device data.  Last Pain:  Vitals:   04/09/23 1201  TempSrc: Oral  PainSc: 0-No pain      Patients Stated Pain Goal: 3 (04/09/23 1201)  Complications: No notable events documented.

## 2023-04-09 NOTE — Anesthesia Postprocedure Evaluation (Signed)
Anesthesia Post Note  Patient: Annette Thomas  Procedure(s) Performed: LEFT 1ST DORSAL COMPARTMENT RELEASE (Left: Wrist) REMOVAL GANGLION OF WRIST (Left: Wrist)     Patient location during evaluation: PACU Anesthesia Type: General Level of consciousness: awake and alert Pain management: pain level controlled Vital Signs Assessment: post-procedure vital signs reviewed and stable Respiratory status: spontaneous breathing, nonlabored ventilation and respiratory function stable Cardiovascular status: blood pressure returned to baseline and stable Postop Assessment: no apparent nausea or vomiting Anesthetic complications: no   No notable events documented.  Last Vitals:  Vitals:   04/09/23 1415 04/09/23 1429  BP: 133/83 135/87  Pulse: 79 70  Resp: 15 16  Temp:  (!) 36.3 C  SpO2: 100% 100%    Last Pain:  Vitals:   04/09/23 1355  TempSrc:   PainSc: Asleep                 Collene Schlichter

## 2023-04-09 NOTE — H&P (Signed)
Annette Thomas is an 52 y.o. female.   Chief Complaint: dequervains, cyst HPI: 52 yo female with left dequervains tenosynovitis and associated ganglion cyst.  This is bothersome to her.  Previous injection has been helpful.  She wishes to have first dorsal compartment release and excision of ganglion cyst.    Allergies: No Known Allergies  Past Medical History:  Diagnosis Date   Hypertension     Past Surgical History:  Procedure Laterality Date   GALLBLADDER SURGERY     IR RADIOLOGIST EVAL & MGMT  03/28/2017    Family History: Family History  Problem Relation Age of Onset   Heart attack Paternal Grandfather    Brain cancer Paternal Grandmother        tumor   Diabetes Maternal Grandmother    Heart attack Maternal Grandfather    Diabetes Maternal Grandfather    Heart attack Father    Diabetes Mother     Social History:   reports that she has never smoked. She has never used smokeless tobacco. She reports current alcohol use. She reports that she does not use drugs.  Medications: Medications Prior to Admission  Medication Sig Dispense Refill   amLODipine (NORVASC) 5 MG tablet Take 10 mg by mouth daily.     Multiple Vitamin (MULTIVITAMIN) tablet Take 1 tablet by mouth daily.     vitamin C (ASCORBIC ACID) 250 MG tablet Take 1 tablet (250 mg total) by mouth daily. 14 tablet 0   VITAMIN D, ERGOCALCIFEROL, PO Take 5,000 Units by mouth daily.     Wheat Dextrin (BENEFIBER DRINK MIX PO) Take by mouth.     Cetirizine HCl 10 MG CAPS Take 1 capsule (10 mg total) by mouth daily for 10 days. 10 capsule 0   fluticasone (FLONASE) 50 MCG/ACT nasal spray Place 1-2 sprays into both nostrils daily for 7 days. 1 g 0   ibuprofen (ADVIL) 600 MG tablet Take 1 tablet (600 mg total) by mouth every 6 (six) hours as needed. 30 tablet 0   mesalamine (LIALDA) 1.2 g EC tablet Take by mouth daily with breakfast.      No results found for this or any previous visit (from the past 48  hour(s)).  No results found.    Blood pressure 134/84, pulse 81, temperature 98.2 F (36.8 C), temperature source Oral, resp. rate 18, height 5' 7.5" (1.715 m), weight 77 kg, last menstrual period 03/08/2021, SpO2 100%.  General appearance: alert, cooperative, and appears stated age Head: Normocephalic, without obvious abnormality, atraumatic Neck: supple, symmetrical, trachea midline Extremities: Intact sensation and capillary refill all digits.  +epl/fpl/io.  No wounds.  Pulses: 2+ and symmetric Skin: Skin color, texture, turgor normal. No rashes or lesions Neurologic: Grossly normal Incision/Wound: none  Assessment/Plan Left wrist dequervains tenosynovitis and associated ganglion cyst.  Non operative and operative treatment options have been discussed with the patient and patient wishes to proceed with operative treatment. Risks, benefits, and alternatives of surgery have been discussed and the patient agrees with the plan of care.   Betha Loa 04/09/2023, 1:03 PM

## 2023-04-09 NOTE — Anesthesia Procedure Notes (Signed)
Procedure Name: LMA Insertion Date/Time: 04/09/2023 1:23 PM  Performed by: Thornell Mule, CRNAPre-anesthesia Checklist: Patient identified, Emergency Drugs available, Suction available and Patient being monitored Patient Re-evaluated:Patient Re-evaluated prior to induction Oxygen Delivery Method: Circle system utilized Preoxygenation: Pre-oxygenation with 100% oxygen Induction Type: IV induction LMA: LMA inserted LMA Size: 4.0 Number of attempts: 1 Placement Confirmation: positive ETCO2 Tube secured with: Tape Dental Injury: Teeth and Oropharynx as per pre-operative assessment

## 2023-04-10 ENCOUNTER — Encounter (HOSPITAL_BASED_OUTPATIENT_CLINIC_OR_DEPARTMENT_OTHER): Payer: Self-pay | Admitting: Orthopedic Surgery

## 2023-04-10 LAB — SURGICAL PATHOLOGY

## 2023-08-10 ENCOUNTER — Other Ambulatory Visit: Payer: Self-pay

## 2023-08-31 ENCOUNTER — Other Ambulatory Visit: Payer: Self-pay

## 2023-09-07 ENCOUNTER — Other Ambulatory Visit: Payer: Self-pay

## 2023-09-11 ENCOUNTER — Other Ambulatory Visit: Payer: Self-pay

## 2023-09-11 MED ORDER — AMLODIPINE BESYLATE 10 MG PO TABS
10.0000 mg | ORAL_TABLET | Freq: Every day | ORAL | 1 refills | Status: DC
  Filled 2023-09-13: qty 90, 90d supply, fill #0

## 2023-09-11 MED ORDER — MESALAMINE ER 0.375 G PO CP24
0.7500 g | ORAL_CAPSULE | Freq: Two times a day (BID) | ORAL | 3 refills | Status: AC
  Filled 2023-09-13: qty 360, 90d supply, fill #0
  Filled 2024-03-06: qty 360, 90d supply, fill #1

## 2023-09-13 ENCOUNTER — Other Ambulatory Visit: Payer: Self-pay

## 2023-10-05 ENCOUNTER — Other Ambulatory Visit: Payer: Self-pay | Admitting: Family Medicine

## 2023-10-05 DIAGNOSIS — Z1231 Encounter for screening mammogram for malignant neoplasm of breast: Secondary | ICD-10-CM

## 2023-10-08 DIAGNOSIS — Z8719 Personal history of other diseases of the digestive system: Secondary | ICD-10-CM | POA: Diagnosis not present

## 2023-10-08 DIAGNOSIS — R195 Other fecal abnormalities: Secondary | ICD-10-CM | POA: Diagnosis not present

## 2023-10-08 DIAGNOSIS — R634 Abnormal weight loss: Secondary | ICD-10-CM | POA: Diagnosis not present

## 2023-10-15 ENCOUNTER — Other Ambulatory Visit: Payer: Self-pay

## 2023-10-15 DIAGNOSIS — I10 Essential (primary) hypertension: Secondary | ICD-10-CM | POA: Diagnosis not present

## 2023-10-15 DIAGNOSIS — F418 Other specified anxiety disorders: Secondary | ICD-10-CM | POA: Diagnosis not present

## 2023-10-15 DIAGNOSIS — K5190 Ulcerative colitis, unspecified, without complications: Secondary | ICD-10-CM | POA: Diagnosis not present

## 2023-10-15 DIAGNOSIS — K5900 Constipation, unspecified: Secondary | ICD-10-CM | POA: Diagnosis not present

## 2023-10-15 DIAGNOSIS — E559 Vitamin D deficiency, unspecified: Secondary | ICD-10-CM | POA: Diagnosis not present

## 2023-10-15 DIAGNOSIS — J301 Allergic rhinitis due to pollen: Secondary | ICD-10-CM | POA: Diagnosis not present

## 2023-10-15 DIAGNOSIS — Z0000 Encounter for general adult medical examination without abnormal findings: Secondary | ICD-10-CM | POA: Diagnosis not present

## 2023-10-15 MED ORDER — HYDROXYZINE HCL 10 MG PO TABS
10.0000 mg | ORAL_TABLET | Freq: Every day | ORAL | 0 refills | Status: AC | PRN
  Filled 2023-10-15: qty 45, 22d supply, fill #0

## 2023-10-15 MED ORDER — AMLODIPINE BESYLATE 10 MG PO TABS
10.0000 mg | ORAL_TABLET | Freq: Every day | ORAL | 1 refills | Status: AC
  Filled 2023-12-18: qty 90, 90d supply, fill #0
  Filled 2024-03-06: qty 90, 90d supply, fill #1

## 2023-10-17 ENCOUNTER — Other Ambulatory Visit: Payer: Self-pay

## 2023-10-30 ENCOUNTER — Ambulatory Visit
Admission: RE | Admit: 2023-10-30 | Discharge: 2023-10-30 | Discharge status: Home / Self Care | Source: Ambulatory Visit | Attending: Family Medicine | Admitting: Family Medicine

## 2023-10-30 ENCOUNTER — Ambulatory Visit

## 2023-10-30 DIAGNOSIS — Z1231 Encounter for screening mammogram for malignant neoplasm of breast: Secondary | ICD-10-CM | POA: Diagnosis not present

## 2023-11-22 ENCOUNTER — Other Ambulatory Visit: Payer: Self-pay

## 2023-11-22 MED ORDER — PEG 3350-KCL-NA BICARB-NACL 420 G PO SOLR
ORAL | 0 refills | Status: AC
  Filled 2023-11-22: qty 4000, 1d supply, fill #0

## 2023-11-22 MED ORDER — BISACODYL 5 MG PO TBEC
DELAYED_RELEASE_TABLET | ORAL | 0 refills | Status: AC
  Filled 2023-11-22: qty 4, 1d supply, fill #0

## 2023-12-03 DIAGNOSIS — K5730 Diverticulosis of large intestine without perforation or abscess without bleeding: Secondary | ICD-10-CM | POA: Diagnosis not present

## 2023-12-03 DIAGNOSIS — R195 Other fecal abnormalities: Secondary | ICD-10-CM | POA: Diagnosis not present

## 2023-12-03 DIAGNOSIS — K523 Indeterminate colitis: Secondary | ICD-10-CM | POA: Diagnosis not present

## 2023-12-03 DIAGNOSIS — K644 Residual hemorrhoidal skin tags: Secondary | ICD-10-CM | POA: Diagnosis not present

## 2023-12-03 DIAGNOSIS — R634 Abnormal weight loss: Secondary | ICD-10-CM | POA: Diagnosis not present

## 2023-12-03 DIAGNOSIS — K635 Polyp of colon: Secondary | ICD-10-CM | POA: Diagnosis not present

## 2023-12-03 DIAGNOSIS — K5289 Other specified noninfective gastroenteritis and colitis: Secondary | ICD-10-CM | POA: Diagnosis not present

## 2023-12-03 DIAGNOSIS — K648 Other hemorrhoids: Secondary | ICD-10-CM | POA: Diagnosis not present

## 2023-12-03 DIAGNOSIS — K5190 Ulcerative colitis, unspecified, without complications: Secondary | ICD-10-CM | POA: Diagnosis not present

## 2023-12-18 ENCOUNTER — Other Ambulatory Visit: Payer: Self-pay

## 2024-02-07 ENCOUNTER — Other Ambulatory Visit: Payer: Self-pay

## 2024-02-07 MED ORDER — SHINGRIX 50 MCG/0.5ML IM SUSR
0.5000 mL | INTRAMUSCULAR | 1 refills | Status: AC
  Filled 2024-02-07: qty 0.5, 1d supply, fill #0
  Filled 2024-06-06: qty 0.5, 1d supply, fill #1

## 2024-03-04 ENCOUNTER — Other Ambulatory Visit: Payer: Self-pay

## 2024-03-04 DIAGNOSIS — L659 Nonscarring hair loss, unspecified: Secondary | ICD-10-CM | POA: Diagnosis not present

## 2024-03-04 DIAGNOSIS — R5383 Other fatigue: Secondary | ICD-10-CM | POA: Diagnosis not present

## 2024-03-04 DIAGNOSIS — R252 Cramp and spasm: Secondary | ICD-10-CM | POA: Diagnosis not present

## 2024-03-04 DIAGNOSIS — M545 Low back pain, unspecified: Secondary | ICD-10-CM | POA: Diagnosis not present

## 2024-03-04 MED ORDER — MELOXICAM 15 MG PO TABS
15.0000 mg | ORAL_TABLET | Freq: Every day | ORAL | 1 refills | Status: AC
  Filled 2024-03-04: qty 30, 30d supply, fill #0

## 2024-03-13 DIAGNOSIS — M5459 Other low back pain: Secondary | ICD-10-CM | POA: Diagnosis not present

## 2024-04-10 ENCOUNTER — Other Ambulatory Visit (HOSPITAL_COMMUNITY): Payer: Self-pay

## 2024-04-10 ENCOUNTER — Other Ambulatory Visit: Payer: Self-pay

## 2024-04-11 ENCOUNTER — Other Ambulatory Visit: Payer: Self-pay

## 2024-04-11 MED ORDER — HYDROXYZINE HCL 10 MG PO TABS
10.0000 mg | ORAL_TABLET | Freq: Every day | ORAL | 0 refills | Status: AC | PRN
  Filled 2024-04-11: qty 45, 22d supply, fill #0

## 2024-04-24 DIAGNOSIS — M545 Low back pain, unspecified: Secondary | ICD-10-CM | POA: Diagnosis not present

## 2024-04-24 DIAGNOSIS — G8929 Other chronic pain: Secondary | ICD-10-CM | POA: Diagnosis not present

## 2024-04-25 ENCOUNTER — Other Ambulatory Visit (HOSPITAL_COMMUNITY): Payer: Self-pay | Admitting: Sports Medicine

## 2024-04-25 DIAGNOSIS — M545 Low back pain, unspecified: Secondary | ICD-10-CM

## 2024-05-05 ENCOUNTER — Ambulatory Visit (HOSPITAL_COMMUNITY)

## 2024-05-05 ENCOUNTER — Encounter (HOSPITAL_COMMUNITY): Payer: Self-pay

## 2024-06-06 ENCOUNTER — Other Ambulatory Visit: Payer: Self-pay
